# Patient Record
Sex: Female | Born: 1955 | Race: Black or African American | Hispanic: No | Marital: Married | Smoking: Former smoker
Health system: Southern US, Community
[De-identification: ages and names within clinical notes are randomized; demographics above are authoritative.]

## PROBLEM LIST (undated history)

## (undated) DIAGNOSIS — I10 Essential (primary) hypertension: Secondary | ICD-10-CM

## (undated) DIAGNOSIS — E119 Type 2 diabetes mellitus without complications: Secondary | ICD-10-CM

## (undated) HISTORY — PX: APPENDECTOMY: SHX54

---

## 2012-08-28 ENCOUNTER — Inpatient Hospital Stay (HOSPITAL_COMMUNITY)
Admission: EM | Admit: 2012-08-28 | Discharge: 2012-08-30 | DRG: 134 | Disposition: A | Discharge status: Home / Self Care | Payer: BC Managed Care – PPO | Attending: Internal Medicine | Admitting: Internal Medicine

## 2012-08-28 ENCOUNTER — Emergency Department (HOSPITAL_COMMUNITY): Payer: BC Managed Care – PPO

## 2012-08-28 ENCOUNTER — Encounter (HOSPITAL_COMMUNITY): Payer: Self-pay | Admitting: Emergency Medicine

## 2012-08-28 DIAGNOSIS — E119 Type 2 diabetes mellitus without complications: Secondary | ICD-10-CM

## 2012-08-28 DIAGNOSIS — Z8249 Family history of ischemic heart disease and other diseases of the circulatory system: Secondary | ICD-10-CM

## 2012-08-28 DIAGNOSIS — I248 Other forms of acute ischemic heart disease: Secondary | ICD-10-CM | POA: Diagnosis present

## 2012-08-28 DIAGNOSIS — I2489 Other forms of acute ischemic heart disease: Secondary | ICD-10-CM | POA: Diagnosis present

## 2012-08-28 DIAGNOSIS — Z91199 Patient's noncompliance with other medical treatment and regimen due to unspecified reason: Secondary | ICD-10-CM

## 2012-08-28 DIAGNOSIS — Z7982 Long term (current) use of aspirin: Secondary | ICD-10-CM

## 2012-08-28 DIAGNOSIS — I10 Essential (primary) hypertension: Principal | ICD-10-CM

## 2012-08-28 DIAGNOSIS — Z9119 Patient's noncompliance with other medical treatment and regimen: Secondary | ICD-10-CM

## 2012-08-28 DIAGNOSIS — R079 Chest pain, unspecified: Secondary | ICD-10-CM

## 2012-08-28 HISTORY — DX: Essential (primary) hypertension: I10

## 2012-08-28 HISTORY — DX: Type 2 diabetes mellitus without complications: E11.9

## 2012-08-28 LAB — CBC WITH DIFFERENTIAL/PLATELET
Basophils Relative: 0 % (ref 0–1)
Eosinophils Absolute: 0.3 10*3/uL (ref 0.0–0.7)
Eosinophils Relative: 4 % (ref 0–5)
Lymphs Abs: 2.4 10*3/uL (ref 0.7–4.0)
MCH: 29.8 pg (ref 26.0–34.0)
MCHC: 34.5 g/dL (ref 30.0–36.0)
MCV: 86.2 fL (ref 78.0–100.0)
Monocytes Relative: 6 % (ref 3–12)
Platelets: 249 10*3/uL (ref 150–400)
RBC: 4.5 MIL/uL (ref 3.87–5.11)

## 2012-08-28 LAB — PROTIME-INR
INR: 0.96 (ref 0.00–1.49)
Prothrombin Time: 12.6 seconds (ref 11.6–15.2)

## 2012-08-28 LAB — COMPREHENSIVE METABOLIC PANEL
Albumin: 3.8 g/dL (ref 3.5–5.2)
BUN: 19 mg/dL (ref 6–23)
Calcium: 9.9 mg/dL (ref 8.4–10.5)
GFR calc Af Amer: >90 mL/min (ref >90)
Glucose, Bld: 137 mg/dL — ABNORMAL HIGH (ref 70–99)
Sodium: 137 mEq/L (ref 135–145)
Total Protein: 7.3 g/dL (ref 6.0–8.3)

## 2012-08-28 LAB — RAPID URINE DRUG SCREEN, HOSP PERFORMED
Barbiturates: NOT DETECTED
Cocaine: NOT DETECTED
Tetrahydrocannabinol: POSITIVE — AB

## 2012-08-28 LAB — GLUCOSE, CAPILLARY: Glucose-Capillary: 110 mg/dL — ABNORMAL HIGH (ref 70–99)

## 2012-08-28 LAB — TROPONIN I: Troponin I: <0.3 ng/mL (ref <0.30)

## 2012-08-28 LAB — HEMOGLOBIN A1C
Hgb A1c MFr Bld: 6.4 % — ABNORMAL HIGH (ref <5.7)
Mean Plasma Glucose: 137 mg/dL — ABNORMAL HIGH (ref <117)

## 2012-08-28 LAB — CK TOTAL AND CKMB (NOT AT ARMC)
CK, MB: 4.3 ng/mL — ABNORMAL HIGH (ref 0.3–4.0)
Relative Index: 2.1 (ref 0.0–2.5)

## 2012-08-28 MED ORDER — LIVING WELL WITH DIABETES BOOK
Freq: Once | Status: AC
  Administered 2012-08-28: 18:00:00
  Filled 2012-08-28 (×2): qty 1

## 2012-08-28 MED ORDER — HYDRALAZINE HCL 20 MG/ML IJ SOLN
10.0000 mg | Freq: Four times a day (QID) | INTRAMUSCULAR | Status: DC | PRN
  Administered 2012-08-28 – 2012-08-29 (×2): 10 mg via INTRAVENOUS
  Filled 2012-08-28 (×2): qty 1

## 2012-08-28 MED ORDER — HYDRALAZINE HCL 20 MG/ML IJ SOLN: 5.0000 mg | Freq: Four times a day (QID) | INTRAMUSCULAR | Status: DC | PRN

## 2012-08-28 MED ORDER — SODIUM CHLORIDE 0.9 % IJ SOLN
3.0000 mL | Freq: Two times a day (BID) | INTRAMUSCULAR | Status: DC
  Administered 2012-08-28 – 2012-08-30 (×5): 3 mL via INTRAVENOUS

## 2012-08-28 MED ORDER — HEPARIN SODIUM (PORCINE) 5000 UNIT/ML IJ SOLN
5000.0000 [IU] | Freq: Three times a day (TID) | INTRAMUSCULAR | Status: DC
  Administered 2012-08-28 – 2012-08-29 (×2): 5000 [IU] via SUBCUTANEOUS
  Filled 2012-08-28 (×8): qty 1

## 2012-08-28 MED ORDER — ASPIRIN 81 MG PO CHEW: 324.0000 mg | CHEWABLE_TABLET | Freq: Once | ORAL | Status: DC

## 2012-08-28 MED ORDER — INSULIN ASPART 100 UNIT/ML ~~LOC~~ SOLN
0.0000 [IU] | Freq: Three times a day (TID) | SUBCUTANEOUS | Status: DC
  Administered 2012-08-29: 1 [IU] via SUBCUTANEOUS

## 2012-08-28 MED ORDER — MORPHINE SULFATE 2 MG/ML IJ SOLN: 2.0000 mg | INTRAMUSCULAR | Status: DC | PRN

## 2012-08-28 MED ORDER — ATORVASTATIN CALCIUM 80 MG PO TABS
80.0000 mg | ORAL_TABLET | Freq: Every day | ORAL | Status: DC
  Administered 2012-08-28: 80 mg via ORAL
  Filled 2012-08-28 (×3): qty 1

## 2012-08-28 MED ORDER — BLOOD PRESSURE CONTROL BOOK
Freq: Once | Status: AC
  Administered 2012-08-28: 18:00:00
  Filled 2012-08-28: qty 1

## 2012-08-28 MED ORDER — NITROGLYCERIN 0.4 MG SL SUBL: 0.4000 mg | SUBLINGUAL_TABLET | SUBLINGUAL | Status: DC | PRN

## 2012-08-28 MED ORDER — ASPIRIN EC 81 MG PO TBEC
81.0000 mg | DELAYED_RELEASE_TABLET | Freq: Every day | ORAL | Status: DC
  Administered 2012-08-29 – 2012-08-30 (×2): 81 mg via ORAL
  Filled 2012-08-28 (×2): qty 1

## 2012-08-28 MED ORDER — METOPROLOL SUCCINATE ER 50 MG PO TB24
50.0000 mg | ORAL_TABLET | Freq: Once | ORAL | Status: AC
  Administered 2012-08-28: 50 mg via ORAL
  Filled 2012-08-28: qty 1

## 2012-08-28 MED ORDER — EXERCISE FOR HEART AND HEALTH BOOK
Freq: Once | Status: AC
  Administered 2012-08-28: 18:00:00
  Filled 2012-08-28 (×2): qty 1

## 2012-08-28 MED ORDER — METOPROLOL SUCCINATE ER 25 MG PO TB24
25.0000 mg | ORAL_TABLET | Freq: Every day | ORAL | Status: DC
  Administered 2012-08-28: 25 mg via ORAL
  Filled 2012-08-28: qty 1

## 2012-08-28 MED ORDER — LABETALOL HCL 5 MG/ML IV SOLN
10.0000 mg | Freq: Once | INTRAVENOUS | Status: AC
  Administered 2012-08-28: 10 mg via INTRAVENOUS
  Filled 2012-08-28: qty 4

## 2012-08-28 MED ORDER — LISINOPRIL 10 MG PO TABS
10.0000 mg | ORAL_TABLET | Freq: Every day | ORAL | Status: DC
  Administered 2012-08-28: 10 mg via ORAL
  Filled 2012-08-28 (×2): qty 1

## 2012-08-28 MED ORDER — METOPROLOL SUCCINATE ER 100 MG PO TB24
100.0000 mg | ORAL_TABLET | Freq: Every day | ORAL | Status: DC
  Administered 2012-08-29 – 2012-08-30 (×2): 100 mg via ORAL
  Filled 2012-08-28 (×2): qty 1

## 2012-08-28 NOTE — Progress Notes (Signed)
Case manager met patient at bedside.Role of Case Manager explained.Epic showing patient without a PCP or health insurance.Patient reports she has [employer]/ [employer] but wants to obtain A new PCP. Patient [place] in [place].PCP- resources provided to patient from the blue cross website.Patient reports the PCP listing is  Near her home and she will follow up.Registartion  Contacted and they will add patients current PCP provider/ health Insurance to [facility].No further Case Manager needs identified at this time.

## 2012-08-28 NOTE — ED Notes (Signed)
First attempt made to call report to unit 3W without success.

## 2012-08-28 NOTE — ED Notes (Signed)
Report called to Marylu Lund, RN unit 3W.

## 2012-08-28 NOTE — ED Provider Notes (Signed)
CSN: [id number]     Arrival date & time 08/28/12  0918 History     First MD Initiated Contact with Patient 08/28/12 [phone]     Chief Complaint  Patient presents with  . Chest Pain  . Shoulder Pain   (Consider location/radiation/quality/duration/timing/severity/associated sxs/prior Treatment) HPI Comments: Patient presents with a three-day history of left sided chest heaviness that radiates to her left arm. She reports the pain comes and goes and lasts for several hours for minutes at a time. It is worse when she is lifting boxes at her job at [employer]. She denies any injury. She denies any shortness of breath, nausea, vomiting, diaphoresis or chills. She reports no cardiac history. She has a history of diabetes and hypertension. She had negative stress test more than 5 years ago. She currently does not have a doctor is not take any blood pressure medications. She denies any pain but endorses heaviness. She is able to ambulate without getting pain  and able to go upstairs without getting pain.  The history is provided by the patient.    Past Medical History  Diagnosis Date  . Hypertension   . Diabetes mellitus without complication    Past Surgical History  Procedure Laterality Date  . Cesarean section     History reviewed. No pertinent family history. History  Substance Use Topics  . Smoking status: Never Smoker   . Smokeless tobacco: Not on file  . Alcohol Use: No   OB History   Grav Para Term Preterm Abortions TAB SAB Ect Mult Living                 Review of Systems  Constitutional: Negative for fever, activity change and appetite change.  HENT: Negative for congestion and rhinorrhea.   Respiratory: Positive for chest tightness and shortness of breath. Negative for cough.   Cardiovascular: Positive for chest pain.  Gastrointestinal: Negative for nausea, vomiting and abdominal pain.  Genitourinary: Negative for dysuria and hematuria.  Skin: Negative for rash.  Neurological:  Negative for dizziness, weakness and headaches.  A complete 10 system review of systems was obtained and all systems are negative except as noted in the HPI and PMH.    Allergies  Codeine  Home Medications   Current Outpatient Rx  Name  Route  Sig  Dispense  Refill  . aspirin 325 MG EC tablet   Oral   Take 325 mg by mouth daily.         . metFORMIN (GLUCOPHAGE) 850 MG tablet   Oral   Take 850 mg by mouth 2 (two) times daily with a meal.          BP 214/101  Pulse 78  Temp(Src) 98.1 F (36.7 C) (Oral)  Resp 15  Ht 5\' 1"  (1.549 m)  Wt 185 lb (83.915 kg)  BMI 34.97 kg/m2  SpO2 100% Physical Exam  Constitutional: She is oriented to person, place, and time. She appears well-developed and well-nourished. No distress.  HENT:  Head: Normocephalic and atraumatic.  Mouth/Throat: Oropharynx is clear and moist. No oropharyngeal exudate.  Eyes: Conjunctivae and EOM are normal. Pupils are equal, round, and reactive to light.  Neck: Normal range of motion. Neck supple.  Cardiovascular: Normal rate, regular rhythm, normal heart sounds and intact distal pulses.   No murmur heard. Pulmonary/Chest: Effort normal and breath sounds normal. No respiratory distress. She exhibits no tenderness.  Abdominal: Soft. There is no tenderness. There is no rebound and no guarding.  obese  Musculoskeletal: Normal range of motion. She exhibits no edema and no tenderness.  Intact DP, PT, radial pulses  Neurological: She is alert and oriented to person, place, and time. No cranial nerve deficit. She exhibits normal muscle tone. Coordination normal.  Skin: Skin is warm. She is not diaphoretic.    ED Course   Procedures (including critical care time)  Labs Reviewed  COMPREHENSIVE METABOLIC PANEL - Abnormal; Notable for the following:    Glucose, Bld 137 (*)    All other components within normal limits  CBC WITH DIFFERENTIAL  TROPONIN I   Dg Chest 2 View  08/28/2012   *RADIOLOGY REPORT*   Clinical Data: Left-sided chest pain  CHEST - 2 VIEW  Comparison: None.  Findings: Lungs are clear. No pleural effusion or pneumothorax.  The heart is top normal in size.  Degenerative changes of the visualized thoracolumbar spine.  IMPRESSION: No evidence of acute cardiopulmonary disease.   Original Report Authenticated By: Charline Bills, M.D.   1. Chest pain   2. Hypertension     MDM  Intermittent chest heaviness that radiates to her left arm it is worse with exertion. Hypertensive and noncompliant with medications.  EKG shows nonspecific ST changes. Troponin is negative. Blood pressure has spontaneously improved to 190 systolic. Heart score 4. Patient's chest pain has both typical and atypical features. Given her history of obesity, diabetes, hypertension, will admit for observation and cardiac rule out.    Date: 08/28/2012  Rate: 71  Rhythm: normal sinus rhythm  QRS Axis: normal  Intervals: normal  ST/T Wave abnormalities: nonspecific ST/T changes  Conduction Disutrbances:none  Narrative Interpretation:   Old EKG Reviewed: none available    Glynn Octave, MD 08/28/12 1210

## 2012-08-28 NOTE — H&P (Signed)
Date: 08/28/2012               Patient Name:  Leslie White MRN: [id number]  DOB: 1955/07/27 Age / Sex: 57 y.o., female   PCP: Pcp Not In System         Medical Service: Internal Medicine Teaching Service         Attending Physician: Dr. Judyann Munson, MD    First Contact: Dr. Yetta Barre Pager: [phone]  Second Contact: Dr. Verdie Mosher Pager: [phone]       After Hours (After 5p/  First Contact Pager: [phone]  weekends / holidays): Second Contact Pager: [phone]   Chief Complaint: Chest pain  History of Present Illness:   Ms. Ruqayyah Lute is a 57 y.o. y/o female w/ PMHx of HTN and DM presents to the ED w/ complaints of chest pain. She says she has had chest pain for the last few days on and off and admits that it is mostly on exertion. She works at [employer] and says she lifts boxes and is always on her feet and feels pain at her chest during these times. This morning, the pain woke her from her sleep and she says it was about a 5/10 in severity and is a dull pain/pressure located in her upper left chest that radiates into her jaw, shoulder and left arm. She says she took an ASA 325 mg at home and it did not provide relief. She says the pain is not worsened by movement or deep inspiration. She denies any associated symptoms such as SOB, DOE, lightheadedness, dizziness, diaphoresis, nausea, or vomiting. The patient dies state that her mother had heart problems and says she had her first heart attack in her 35's.  While in the ED, the patient was shown to have severely elevated blood pressure with a systolic in the 220's. She has not been compliant with her blood pressure medications recently because she is in between PCP's and did not get any refills. She usually takes metoprolol 100 mg bid and prinzide 20-12.5 qd. The patient denies any recent extended travel, h/o DVT/PE, clotting disorders, or h/o smoking. She also denies any recent fever, chills, cough, or URI symptoms. CXR shows  no evidence of cardiopulmonary disease.  Meds: Current Facility-Administered Medications  Medication Dose Route Frequency Provider Last Rate Last Dose  . [START ON 08/29/2012] aspirin EC tablet 81 mg  81 mg Oral Daily Lorretta Harp, MD      . atorvastatin (LIPITOR) tablet 80 mg  80 mg Oral q1800 Lorretta Harp, MD      . heparin injection 5,000 Units  5,000 Units Subcutaneous Q8H Lorretta Harp, MD      . insulin aspart (novoLOG) injection 0-9 Units  0-9 Units Subcutaneous TID WC Lorretta Harp, MD      . lisinopril (PRINIVIL,ZESTRIL) tablet 10 mg  10 mg Oral Daily Lorretta Harp, MD      . metoprolol succinate (TOPROL-XL) 24 hr tablet 25 mg  25 mg Oral Daily Lorretta Harp, MD   25 mg at 08/28/12 1357  . morphine 2 MG/ML injection 2 mg  2 mg Intravenous Q4H PRN Lorretta Harp, MD      . nitroGLYCERIN (NITROSTAT) SL tablet 0.4 mg  0.4 mg Sublingual Q5 min PRN Glynn Octave, MD      . sodium chloride 0.9 % injection 3 mL  3 mL Intravenous Q12H Lorretta Harp, MD        Allergies: Allergies as of 08/28/2012 - Review Complete 08/28/2012  Allergen Reaction  Noted  . Codeine Nausea And Vomiting 08/28/2012   Past Medical History  Diagnosis Date  . Hypertension   . Diabetes mellitus without complication    Past Surgical History  Procedure Laterality Date  . Cesarean section     History reviewed. No pertinent family history. History   Social History  . Marital Status: Married    Spouse Name: N/A    Number of Children: N/A  . Years of Education: N/A   Occupational History  . Not on file.   Social History Main Topics  . Smoking status: Never Smoker   . Smokeless tobacco: Not on file  . Alcohol Use: No  . Drug Use: No  . Sexually Active: Not on file   Other Topics Concern  . Not on file   Social History Narrative  . No narrative on file    Review of Systems: General: Denies fever, chills, diaphoresis, appetite change and fatigue.  HEENT: Denies change in vision, eye pain, redness, hearing loss, congestion,  sore throat, rhinorrhea, sneezing, mouth sores, trouble swallowing, neck pain, neck stiffness and tinnitus.   Respiratory: Denies SOB, DOE, cough, chest tightness, and wheezing.   Cardiovascular: Positive for chest pain. Denies palpitations and leg swelling.  Gastrointestinal: Denies nausea, vomiting, abdominal pain, diarrhea, constipation, blood in stool and abdominal distention.  Genitourinary: Denies dysuria, urgency, frequency, hematuria, flank pain and difficulty urinating.  Endocrine: Denies hot or cold intolerance, sweats, polyuria, polydipsia. Musculoskeletal: Denies myalgias, back pain, joint swelling, arthralgias and gait problem.  Skin: Denies pallor, rash and wounds.  Neurological: Denies dizziness, seizures, syncope, weakness, lightheadedness, numbness and headaches.  Hematological: Denies adenopathy,easy bruising, personal or family bleeding history.  Psychiatric/Behavioral: Denies mood changes, confusion, nervousness, sleep disturbance and agitation.  Physical Exam: Filed Vitals:   08/28/12 1330 08/28/12 1345 08/28/12 1350 08/28/12 1450  BP: 177/84 170/75 170/75 207/80  Pulse: 67 64 68 64  Temp:    98.3 F (36.8 C)  TempSrc:    Oral  Resp: 16 12 18 16   Height:    5\' 1"  (1.549 m)  Weight:    182 lb 5.1 oz (82.7 kg)  SpO2: 100% 100% 99% 100%   General: Vital signs reviewed.  Patient is a well-developed and well-nourished, in no acute distress and cooperative with exam. Alert and oriented x3.  Head: Normocephalic and atraumatic. Nose: No erythema or drainage noted.  Turbinates normal. Mouth: No erythema, exudates, sores, or ulcerations. Moist mucus membranes. Eyes: PERRL, EOMI, conjunctivae normal, No scleral icterus.  Neck: Supple, trachea midline, normal ROM, No JVD, masses, thyromegaly, or carotid bruit present.  Cardiovascular: RRR, S1 normal, S2 normal, no murmurs, gallops, or rubs. Pulmonary/Chest: Normal respiratory effort, CTAB, no wheezes, rales, or  rhonchi. Abdominal: Soft. Non-tender, non-distended, bowel sounds are normal, no masses, organomegaly, or guarding present.  Musculoskeletal: No joint deformities, erythema, or stiffness, ROM full and no nontender. Extremities: No swelling or edema,  pulses symmetric and intact bilaterally. No cyanosis or clubbing. Hematology: no cervical, inginal, or axillary adenopathy.  Neurological: A&O x3, Strength is normal and symmetric bilaterally, cranial nerve II-XII are grossly intact, no focal motor deficit, sensory intact to light touch bilaterally.  Skin: Warm, dry and intact. No rashes or erythema. Psychiatric: Normal mood and affect. speech and behavior is normal. Cognition and memory are normal.   Lab results: Basic Metabolic Panel:  Recent Labs  45/40/98 0950  NA 137  K 4.0  CL 100  CO2 25  GLUCOSE 137*  BUN 19  CREATININE  0.76  CALCIUM 9.9   Liver Function Tests:  Recent Labs  08/28/12 0950  AST 19  ALT 20  ALKPHOS 70  BILITOT 0.4  PROT 7.3  ALBUMIN 3.8   CBC:  Recent Labs  08/28/12 0950  WBC 5.8  NEUTROABS 2.8  HGB 13.4  HCT 38.8  MCV 86.2  PLT 249   Cardiac Enzymes:  Recent Labs  08/28/12 0950 08/28/12 1230  CKTOTAL  --  209*  CKMB  --  4.3*  TROPONINI <0.30 <0.30   BNP: No results found for this basename: PROBNP,  in the last 72 hours D-Dimer: No results found for this basename: DDIMER,  in the last 72 hours CBG: No results found for this basename: GLUCAP,  in the last 72 hours Hemoglobin A1C: No results found for this basename: HGBA1C,  in the last 72 hours Fasting Lipid Panel: No results found for this basename: CHOL, HDL, LDLCALC, TRIG, CHOLHDL, LDLDIRECT,  in the last 72 hours Thyroid Function Tests: No results found for this basename: TSH, T4TOTAL, FREET4, T3FREE, THYROIDAB,  in the last 72 hours Anemia Panel: No results found for this basename: VITAMINB12, FOLATE, FERRITIN, TIBC, IRON, RETICCTPCT,  in the last 72  hours Coagulation:  Recent Labs  08/28/12 1227  LABPROT 12.6  INR 0.96   Imaging results:  Dg Chest 2 View  08/28/2012   *RADIOLOGY REPORT*  Clinical Data: Left-sided chest pain  CHEST - 2 VIEW  Comparison: None.  Findings: Lungs are clear. No pleural effusion or pneumothorax.  The heart is top normal in size.  Degenerative changes of the visualized thoracolumbar spine.  IMPRESSION: No evidence of acute cardiopulmonary disease.   Original Report Authenticated By: Charline Bills, M.D.   Other results: EKG: NSR @ 71 bpm. RAE. Twi's in inferior and lateral leads. No previous ECG.  Assessment & Plan by Problem:  #Possible NSTEMI vs non-cardiac related chest pain- Ms. Nakiea Metzner is a 57 y.o. y/o female w/ PMHx of HTN and DM presents to the ED w/ complaints of chest pain for the past 3 days. On arrival to the ED, the patient's blood pressure was very high w/ systolic in the 220's. The patient admits to recent non-compliance to HTN medications because she is in between PCP's and did not have any refills. He chest pain is most likely related to a demand ischemia 2/2 hypertensive emergency.  -Troponins are negative x2. Continue cycling. -ECG shows twi's in inferior and lateral leads, but no previous ECG for comparison. Obtain another ECG to assess for dynamic changes after blood pressure has been controlled. -CK MB and CK total are elevated at 4.3 and 209, respectively.  -Continue ASA 81 mg  #Hypetensive Urgency vs. Emergency- Patient presented to the ED w/ systolic BP in the 220's and has been non-compliant w/ her BP meds at home.  -Given labetalol 10 mg in the ED.  -Started on home meds; giving metoprolol 100 mg qd + lisinopril 10 mg qd -Give Hydralazine 5 mg prn for SBP >190  #DM -On sensitive insulin SS coverage. -Metformin held.  DVT PPx: Heparin  Dispo: Disposition is deferred at this time, awaiting improvement of current medical problems. Anticipated discharge in  approximately 1-2 day(s).   The patient does not have a current PCP (Pcp Not In System) and does not need an [facility] hospital follow-up appointment after discharge.  The patient does not have transportation limitations that hinder transportation to clinic appointments.  Signed: Lars Masson, MD 08/28/2012, 3:21 PM

## 2012-08-28 NOTE — Progress Notes (Signed)
Pt cont to be HTN even after receiving lisinopril and metoprolol 186/83 MD notified.

## 2012-08-28 NOTE — ED Notes (Signed)
Second attempt to call report to unit 3W without success.

## 2012-08-28 NOTE — ED Notes (Signed)
Pt reports left sided CP that radiates to left arm x 2 days. Pt has not taken her prescribed BP meds for about 3 months. Pt does not have a PMD at this time.

## 2012-08-29 ENCOUNTER — Other Ambulatory Visit: Payer: Self-pay

## 2012-08-29 LAB — GLUCOSE, CAPILLARY
Glucose-Capillary: 129 mg/dL — ABNORMAL HIGH (ref 70–99)
Glucose-Capillary: 103 mg/dL — ABNORMAL HIGH (ref 70–99)
Glucose-Capillary: 119 mg/dL — ABNORMAL HIGH (ref 70–99)

## 2012-08-29 LAB — LIPID PANEL
Cholesterol: 180 mg/dL (ref 0–200)
Total CHOL/HDL Ratio: 3.3 RATIO
Triglycerides: 80 mg/dL (ref <150)

## 2012-08-29 LAB — BASIC METABOLIC PANEL
CO2: 29 mEq/L (ref 19–32)
Chloride: 102 mEq/L (ref 96–112)
Creatinine, Ser: 0.79 mg/dL (ref 0.50–1.10)
GFR calc Af Amer: >90 mL/min (ref >90)
Sodium: 139 mEq/L (ref 135–145)

## 2012-08-29 MED ORDER — LISINOPRIL 20 MG PO TABS
20.0000 mg | ORAL_TABLET | Freq: Every day | ORAL | Status: DC
  Administered 2012-08-29 – 2012-08-30 (×2): 20 mg via ORAL
  Filled 2012-08-29 (×2): qty 1

## 2012-08-29 MED ORDER — ATORVASTATIN CALCIUM 80 MG PO TABS: 40.0000 mg | ORAL_TABLET | Freq: Every day | ORAL | Status: DC

## 2012-08-29 MED ORDER — NITROGLYCERIN 0.4 MG SL SUBL: 0.4000 mg | SUBLINGUAL_TABLET | SUBLINGUAL | Status: DC | PRN

## 2012-08-29 MED ORDER — LISINOPRIL 20 MG PO TABS: 20.0000 mg | ORAL_TABLET | Freq: Every day | ORAL | Status: DC

## 2012-08-29 MED ORDER — HYDROCHLOROTHIAZIDE 25 MG PO TABS: 25.0000 mg | ORAL_TABLET | Freq: Every day | ORAL | Status: DC

## 2012-08-29 MED ORDER — HYDROCHLOROTHIAZIDE 25 MG PO TABS
25.0000 mg | ORAL_TABLET | Freq: Every day | ORAL | Status: DC
  Administered 2012-08-29 – 2012-08-30 (×2): 25 mg via ORAL
  Filled 2012-08-29 (×2): qty 1

## 2012-08-29 NOTE — H&P (Signed)
Date: 08/29/2012  Patient name: Leslie White  Medical record number: [id number]  Date of birth: 1956-01-10   I have seen and evaluated Leslie White and discussed their care with the Residency Team.  Leslie White is a pleasant 57yo F with known history of HTN, ran out of medications x 1 month and now presents with chest pressure x 3-5 days with pain radiating down arms. On admit found to have hypertensive urgency, and evaluated/admitted to rule out acute coronary syndrome, and management of hypertension. She feel much improved since being admitted although still has some residual chest pressure, 2/10 within first 24hrs of admit  Physical Exam: Blood pressure 194/72, pulse 66, temperature 98.3 F (36.8 C), temperature source Oral, resp. rate 20, height 5\' 1"  (1.549 m), weight 180 lb 11.2 oz (81.965 kg), SpO2 99.00%. Physical Exam  Constitutional:  oriented to person, place, and time. appears well-developed and well-nourished. No distress.  HENT:  Mouth/Throat: Oropharynx is clear and moist. No oropharyngeal exudate.  Cardiovascular: Normal rate, regular rhythm and normal heart sounds. Exam reveals no gallop and no friction rub.  No murmur heard.  Pulmonary/Chest: Effort normal and breath sounds normal. No respiratory distress.has no wheezes.  Abdominal: Soft. Bowel sounds are normal. exhibits no distension. There is no tenderness.  Lymphadenopathy:  no cervical adenopathy.  Neurological:  alert and oriented to person, place, and time.  Skin: Skin is warm and dry. No rash noted. No erythema.  Psychiatric: a normal mood and affect.  behavior is normal.    Lab results: Results for orders placed during the hospital encounter of 08/28/12 (from the past 24 hour(s))  GLUCOSE, CAPILLARY     Status: Abnormal   Collection Time    08/28/12  4:53 PM      Result Value Range   Glucose-Capillary 110 (*) 70 - 99 mg/dL   Comment 1 Notify RN    TROPONIN I     Status: None     Collection Time    08/28/12  7:03 PM      Result Value Range   Troponin I <0.30  <0.30 ng/mL  TROPONIN I     Status: None   Collection Time    08/29/12 12:10 AM      Result Value Range   Troponin I <0.30  <0.30 ng/mL  BASIC METABOLIC PANEL     Status: Abnormal   Collection Time    08/29/12  7:35 AM      Result Value Range   Sodium 139  135 - 145 mEq/L   Potassium 3.9  3.5 - 5.1 mEq/L   Chloride 102  96 - 112 mEq/L   CO2 29  19 - 32 mEq/L   Glucose, Bld 122 (*) 70 - 99 mg/dL   BUN 17  6 - 23 mg/dL   Creatinine, Ser 9.56  0.50 - 1.10 mg/dL   Calcium 9.8  8.4 - 21.3 mg/dL   GFR calc non Af Amer >90  >90 mL/min   GFR calc Af Amer >90  >90 mL/min  GLUCOSE, CAPILLARY     Status: Abnormal   Collection Time    08/29/12  7:44 AM      Result Value Range   Glucose-Capillary 129 (*) 70 - 99 mg/dL  GLUCOSE, CAPILLARY     Status: Abnormal   Collection Time    08/29/12 12:07 PM      Result Value Range   Glucose-Capillary 103 (*) 70 - 99 mg/dL  LIPID PANEL  Status: Abnormal   Collection Time    08/29/12  2:43 PM      Result Value Range   Cholesterol 180  0 - 200 mg/dL   Triglycerides 80  <161 mg/dL   HDL 54  >09 mg/dL   Total CHOL/HDL Ratio 3.3     VLDL 16  0 - 40 mg/dL   LDL Cholesterol 604 (*) 0 - 99 mg/dL    Imaging results:  Dg Chest 2 View  08/28/2012   *RADIOLOGY REPORT*  Clinical Data: Left-sided chest pain  CHEST - 2 VIEW  Comparison: None.  Findings: Lungs are clear. No pleural effusion or pneumothorax.  The heart is top normal in size.  Degenerative changes of the visualized thoracolumbar spine.  IMPRESSION: No evidence of acute cardiopulmonary disease.   Original Report Authenticated By: Charline Bills, M.D.    Assessment and Plan: I have seen and evaluated the patient as outlined above. I agree with the formulated Assessment and Plan as detailed in the residents' admission note, with the following changes:   Agree with Dr. Yetta Barre' assessment and plan for  hypertension management and ruling out of acute coronary syndrome  Judyann Munson, MD 8/4/20143:47 PM

## 2012-08-29 NOTE — Discharge Summary (Signed)
Name: Leslie White MRN: [id number] DOB: [birth year] 57 y.o. PCP: Pcp Not In System  Date of Admission: 08/28/2012  9:19 AM Date of Discharge: 08/30/2012 Attending Physician: No att. providers found  Discharge Diagnosis: 1. Hypertensive Urgency   Discharge Medications:   Medication List    STOP taking these medications       lisinopril-hydrochlorothiazide 20-12.5 MG per tablet  Commonly known as:  PRINZIDE,ZESTORETIC      TAKE these medications       aspirin 325 MG EC tablet  Take 325 mg by mouth daily.     atorvastatin 80 MG tablet  Commonly known as:  LIPITOR  Take 0.5 tablets (40 mg total) by mouth daily at 6 PM.     hydrochlorothiazide 25 MG tablet  Commonly known as:  HYDRODIURIL  Take 1 tablet (25 mg total) by mouth daily.     lisinopril 20 MG tablet  Commonly known as:  PRINIVIL,ZESTRIL  Take 1 tablet (20 mg total) by mouth daily.     metFORMIN 850 MG tablet  Commonly known as:  GLUCOPHAGE  Take 850 mg by mouth 2 (two) times daily with a meal.     metoprolol 100 MG tablet  Commonly known as:  LOPRESSOR  Take 100 mg by mouth 2 (two) times daily.     nitroGLYCERIN 0.4 MG SL tablet  Commonly known as:  NITROSTAT  Place 1 tablet (0.4 mg total) under the tongue every 5 (five) minutes as needed for chest pain.     VITAMIN D2 PO  Take 1 capsule by mouth See admin instructions. 1.25mg . Take one capsule by mouth every two weeks. No specific day she takes it on.        Disposition and follow-up:   [name] was discharged from [facility] in Good condition.  At the hospital follow up visit please address:  1.  Her current BP medication regimen and optimizing her therapy. Consider increasing Lisinopril dose or adding Norvasc.  2.  Labs / imaging needed at time of follow-up: none  3.  Pending labs/ test needing follow-up: none  Follow-up Appointments:     Follow-up Information   Follow up with Rocco Serene, MD On 09/06/2012. (3:30 PM)    Contact information:   [address] [place] [facility] [place] [address] [phone]       Discharge Instructions: Discharge Orders   Future Appointments Provider Department Dept Phone   09/07/2012 10:30 AM Rocco Serene, MD [facility] [phone]   Future Orders Complete By Expires     Call MD for:  difficulty breathing, headache or visual disturbances  As directed     Call MD for:  severe uncontrolled pain  As directed     Diet - low sodium heart healthy  As directed     Increase activity slowly  As directed        Consultations:  none  Procedures Performed:  Dg Chest 2 View  08/28/2012   *RADIOLOGY REPORT*  Clinical Data: Left-sided chest pain  CHEST - 2 VIEW  Comparison: None.  Findings: Lungs are clear. No pleural effusion or pneumothorax.  The heart is top normal in size.  Degenerative changes of the visualized thoracolumbar spine.  IMPRESSION: No evidence of acute cardiopulmonary disease.   Original Report Authenticated By: Charline Bills, M.D.    Admission HPI:  [name] is a 57 y.o. y/o female w/ PMHx of HTN and DM presents  to the ED w/ complaints of chest pain. She says she has had chest pain for the last few days on and off and admits that it is mostly on exertion. She works at [employer] and says she lifts boxes and is always on her feet and feels pain at her chest during these times. This morning, the pain woke her from her sleep and she says it was about a 5/10 in severity and is a dull pain/pressure located in her upper left chest that radiates into her jaw, shoulder and left arm. She says she took an ASA 325 mg at home and it did not provide relief. She says the pain is not worsened by movement or deep inspiration. She denies any associated symptoms such as SOB, DOE, lightheadedness, dizziness, diaphoresis, nausea, or vomiting. The patient did state that her  mother had heart problems and says she had her first heart attack in her 48's.  While in the ED, the patient was shown to have severely elevated blood pressure with a systolic in the 220's. She has not been compliant with her blood pressure medications recently because she is in between PCP's and did not get any refills. She usually takes metoprolol 100 mg bid and prinzide 20-12.5 qd.  The patient denies any recent extended travel, h/o DVT/PE, clotting disorders, or h/o smoking. She also denies any recent fever, chills, cough, or URI symptoms. CXR shows no evidence of cardiopulmonary disease.  Hospital Course by problem list: Principal Problem:   Chest pain Active Problems:   Hypertension   Diabetes mellitus without complication   1. Chest pain 2/2 demand ischemia vs non-cardiac related chest pain The patient reported ongoing chest pain mostly with exertion at work, but woke her up from sleep the night prior to her admission. On arrival to the ED, the patient had an SBP in the 220's. She reportedly had been non-compliant with her BP meds for 1 month 2/2 being in between PCP's. She described a pain present in her left chest that radiated into her jaw and shoulder. Her troponins were cycled and ECG performed to r/o ACS. Troponins were -ve x3 but ECG showed twi's in inferior and lateral leads, w/ no prior ECG for comparison. CK MB was also sent, and came back at 4.3, slightly above the ULN. She was only given ASA 81 in the ED as her pain was only 4/10 in severity. No nitroglycerin was given. The next day, a repeat ECG was performed and the twi's in inferior leads had corrected. Over the next day, her chest pain became significantly less and became very localized to the left lower chest and was a 2/10 in severity . Her chest pain had completely subsided prior to her discharge on 08/30/12. The patient also reported a family history of CAD. Her mother had her first MI in her 68's. A lipid panel was performed  and showed an LDL of 110. The patient was treated with Lipitor 80 mg in the hospital and discharged on 40 mg qhs.   2. Hypertensive Urgency Patient had SBP in the 220's. She was given Labetalol 10 mg in the ED w/ very little improvement in blood pressure. She was started on Metoprolol 100 mg qd and Lisinopril 10 mg qd, as this was close to her home regimen. Her SBP still remained in the 180's to 200's. She was given Hydralazine 5 mg prn for SBP over 190. No symptoms except for some mild localized left sided chest pain were present at this  time. She denied any change in vision, dizziness, headache, numbness, tingling, palpitations, or SOB. On her next day, her BP was still significantly elevated, so we increased her lisinopril to 20 mg and added HCTZ 25 mg, controlling her BP into the 150's-160's. The patient reports this as her baseline.   3. DM The patient did not have any diabetic issues during this hospitalization. Her blood sugars were well controlled on sensitive ISS. She usually takes Metformin 850 mg bid at home.  Discharge Vitals:   BP 166/82  Pulse 68  Temp(Src) 98.2 F (36.8 C) (Oral)  Resp 20  Ht 5\' 1"  (1.549 m)  Wt 178 lb 3.2 oz (80.831 kg)  BMI 33.69 kg/m2  SpO2 100%  Discharge Labs:  Results for orders placed during the hospital encounter of 08/28/12 (from the past 24 hour(s))  GLUCOSE, CAPILLARY     Status: Abnormal   Collection Time    08/29/12  9:02 PM      Result Value Range   Glucose-Capillary 119 (*) 70 - 99 mg/dL  GLUCOSE, CAPILLARY     Status: Abnormal   Collection Time    08/30/12  7:59 AM      Result Value Range   Glucose-Capillary 114 (*) 70 - 99 mg/dL  GLUCOSE, CAPILLARY     Status: Abnormal   Collection Time    08/30/12 11:18 AM      Result Value Range   Glucose-Capillary 115 (*) 70 - 99 mg/dL    Signed: Lars Masson, MD 08/30/2012, 5:15 PM   Time Spent on Discharge: 35 minutes Services Ordered on Discharge: none Equipment Ordered on Discharge:  none

## 2012-08-29 NOTE — Progress Notes (Signed)
Utilization review completed. Aydden Cumpian, RN, BSN. 

## 2012-08-29 NOTE — Progress Notes (Signed)
Subjective: Leslie White is a 57 y.o. y/o female w/ PMHx of HTN and DM presents to the ED w/ complaints of chest pain, most likely secondary to demand ischemia d/t hypertensive urgency. While in the ED, systolic BP was in the 220's. Troponins -ve x3, ECG shows twi's in inferior and lateral leads. This AM, inferior twi's have corrected.  Patient seen at bedside this AM. She says she feels much better, specifying her chest pain as localized to the left lower chest and 1-2/10 in severity. She denies any other symptoms such as SOB, diaphoresis, nausea, vomiting, jaw or shoulder pain.  BP still high today w/ systolic in the 150's to 180's. Otherwise, vitals are normal.  Objective: Vital signs in last 24 hours: Filed Vitals:   08/29/12 0034 08/29/12 0518 08/29/12 0912 08/29/12 0946  BP: 157/50 162/73 197/81 189/81  Pulse: 66 68 75 66  Temp: 98.5 F (36.9 C) 98.1 F (36.7 C) 98.3 F (36.8 C)   TempSrc: Oral Oral Oral   Resp: 18 18 20    Height:      Weight:  180 lb 11.2 oz (81.965 kg)    SpO2: 99% 100% 99%    Weight change:   Intake/Output Summary (Last 24 hours) at 08/29/12 1418 Last data filed at 08/28/12 1800  Gross per 24 hour  Intake    363 ml  Output      0 ml  Net    363 ml   Physical Exam: General: Alert, cooperative, and in no apparent distress HEENT: Vision grossly intact, oropharynx clear and non-erythematous  Neck: Full range of motion without pain, supple, no lymphadenopathy or carotid bruits Lungs: Clear to ascultation bilaterally, normal work of respiration, no wheezes, rales, ronchi Heart: Regular rate and rhythm, no murmurs, gallops, or rubs Abdomen: Soft, non-tender, non-distended, normal bowel sounds Extremities: No cyanosis, clubbing, or edema Neurologic: Alert & oriented X3, cranial nerves II-XII intact, strength grossly intact, sensation intact to light touch  Lab Results: Basic Metabolic Panel:  Recent Labs Lab 08/28/12 0950  08/29/12 0735  NA 137 139  K 4.0 3.9  CL 100 102  CO2 25 29  GLUCOSE 137* 122*  BUN 19 17  CREATININE 0.76 0.79  CALCIUM 9.9 9.8   Liver Function Tests:  Recent Labs Lab 08/28/12 0950  AST 19  ALT 20  ALKPHOS 70  BILITOT 0.4  PROT 7.3  ALBUMIN 3.8   CBC:  Recent Labs Lab 08/28/12 0950  WBC 5.8  NEUTROABS 2.8  HGB 13.4  HCT 38.8  MCV 86.2  PLT 249   Cardiac Enzymes:  Recent Labs Lab 08/28/12 0950 08/28/12 1230 08/28/12 1903 08/29/12 0010  CKTOTAL  --  209*  --   --   CKMB  --  4.3*  --   --   TROPONINI <0.30 <0.30 <0.30 <0.30   BNP: No results found for this basename: PROBNP,  in the last 168 hours D-Dimer: No results found for this basename: DDIMER,  in the last 168 hours CBG:  Recent Labs Lab 08/28/12 1653 08/29/12 0744 08/29/12 1207  GLUCAP 110* 129* 103*   Hemoglobin A1C:  Recent Labs Lab 08/28/12 1227  HGBA1C 6.4*   Fasting Lipid Panel: No results found for this basename: CHOL, HDL, LDLCALC, TRIG, CHOLHDL, LDLDIRECT,  in the last 168 hours  Coagulation:  Recent Labs Lab 08/28/12 1227  LABPROT 12.6  INR 0.96   Urine Drug Screen: Drugs of Abuse     Component Value Date/Time  LABOPIA NONE DETECTED 08/28/2012 1445   COCAINSCRNUR NONE DETECTED 08/28/2012 1445   LABBENZ NONE DETECTED 08/28/2012 1445   AMPHETMU NONE DETECTED 08/28/2012 1445   THCU POSITIVE* 08/28/2012 1445   LABBARB NONE DETECTED 08/28/2012 1445   Studies/Results: Dg Chest 2 View  08/28/2012   *RADIOLOGY REPORT*  Clinical Data: Left-sided chest pain  CHEST - 2 VIEW  Comparison: None.  Findings: Lungs are clear. No pleural effusion or pneumothorax.  The heart is top normal in size.  Degenerative changes of the visualized thoracolumbar spine.  IMPRESSION: No evidence of acute cardiopulmonary disease.   Original Report Authenticated By: Charline Bills, M.D.   Medications: I have reviewed the patient's current medications. Scheduled Meds: . aspirin EC  81 mg Oral Daily  .  atorvastatin  80 mg Oral q1800  . heparin  5,000 Units Subcutaneous Q8H  . hydrochlorothiazide  25 mg Oral Daily  . insulin aspart  0-9 Units Subcutaneous TID WC  . lisinopril  20 mg Oral Daily  . metoprolol succinate  100 mg Oral Daily  . sodium chloride  3 mL Intravenous Q12H   Continuous Infusions:  PRN Meds:.hydrALAZINE, morphine injection, nitroGLYCERIN  Assessment/Plan:  #Demand ischemia 2/2 hypertensive episode vs non-cardiac related chest pain- [name] is a 57 y/o female w/ PMHx of HTN and DM presents to the ED w/ complaints of chest pain for the past 3 days. On arrival to the ED, the patient's blood pressure was very high w/ systolic in the 220's. The patient admits to recent non-compliance to HTN medications because she is in between PCP's and did not have any refills. Usually takes metoprolol 100 mg bid and prinzide 20-12.5 qd, but has not taken BP meds for 1 month. Chest pain most likely d/t demand ischemia b/c of hypertensive episode. -Troponins are negative x3. CK MB and CK total are elevated at 4.3 and 209, respectively.  -ECG shows twi's in inferior and lateral leads yesterday, but inferior inversion have corrected today. -Started on Metoprolol 100 mg qd, Lisinopril 20 mg, and HCTZ 25 mg. -Continue ASA 81 mg  -Continue Lipitor 80 mg. Lipid panel pending.  #Hypetensive Urgency vs. Emergency- Patient presented to the ED w/ systolic BP in the 220's and has been non-compliant w/ her BP meds at home.  -Started on Metoprolol 100 mg qd, Lisinopril 20 mg, and HCTZ 25 mg. -Give Hydralazine 5 mg prn for SBP >190   #DM  -On sensitive insulin SS coverage.  -Metformin held.   #DVT PPx: Heparin  Dispo: Disposition is deferred at this time, awaiting improvement of current medical problems.  Anticipated discharge in approximately 1-2 day(s).   The patient does not have a current PCP (Pcp Not In System) and does need an [facility] hospital follow-up appointment after  discharge.  The patient does not have transportation limitations that hinder transportation to clinic appointments.  .Services Needed at time of discharge: Y = Yes, Blank = No PT:   OT:   RN:   Equipment:   Other:     LOS: 1 day   Leslie Masson, MD 08/29/2012, 2:18 PM

## 2012-08-30 LAB — GLUCOSE, CAPILLARY: Glucose-Capillary: 115 mg/dL — ABNORMAL HIGH (ref 70–99)

## 2012-08-30 NOTE — Progress Notes (Signed)
Patient discharged home with husband. Discharge instructions reviewed with patient.

## 2012-08-30 NOTE — Progress Notes (Signed)
Subjective: [name] is a 57 y.o. y/o female w/ PMHx of HTN and DM presents to the ED w/ complaints of chest pain, most likely secondary to demand ischemia d/t hypertensive urgency. While in the ED, systolic BP was in the 220's. Troponins -ve x3, ECG shows twi's in inferior and lateral leads. This AM, inferior twi's have corrected. Patient seen at bedside this AM. She has no complaints today. Slept very well. Denies any chest pain this AM. Denies SOB, fever, chills, nausea, vomiting, diaphoresis, abdominal pain, diarrhea or constipation. BP still high today w/ systolic in the 150's to 180's. Patient says this is what she normally runs at home when she is compliant with her meds. Otherwise, vitals are normal. Patient ready for discharge today. She will follow-up in our clinic next Tuesday.  Objective: Vital signs in last 24 hours: Filed Vitals:   08/29/12 2300 08/30/12 0547 08/30/12 0801 08/30/12 1154  BP: 180/76 168/69 178/84 166/82  Pulse:  59 69 68  Temp:  98.1 F (36.7 C) 98.4 F (36.9 C) 98.2 F (36.8 C)  TempSrc:  Oral    Resp:  18 18 20   Height:      Weight:  178 lb 3.2 oz (80.831 kg)    SpO2:  100% 100% 100%   Weight change: -6 lb 12.8 oz (-3.084 kg)  Intake/Output Summary (Last 24 hours) at 08/30/12 1628 Last data filed at 08/30/12 1200  Gross per 24 hour  Intake    480 ml  Output      0 ml  Net    480 ml   Physical Exam: General: Alert, cooperative, and in no apparent distress HEENT: Vision grossly intact, oropharynx clear and non-erythematous  Neck: Full range of motion without pain, supple, no lymphadenopathy or carotid bruits Lungs: Clear to ascultation bilaterally, normal work of respiration, no wheezes, rales, ronchi Heart: Regular rate and rhythm, no murmurs, gallops, or rubs Abdomen: Soft, non-tender, non-distended, normal bowel sounds Extremities: No cyanosis, clubbing, or edema Neurologic: Alert & oriented X3, cranial nerves II-XII  intact, strength grossly intact, sensation intact to light touch  Lab Results: Basic Metabolic Panel:  Recent Labs Lab 08/28/12 0950 08/29/12 0735  NA 137 139  K 4.0 3.9  CL 100 102  CO2 25 29  GLUCOSE 137* 122*  BUN 19 17  CREATININE 0.76 0.79  CALCIUM 9.9 9.8   Liver Function Tests:  Recent Labs Lab 08/28/12 0950  AST 19  ALT 20  ALKPHOS 70  BILITOT 0.4  PROT 7.3  ALBUMIN 3.8   CBC:  Recent Labs Lab 08/28/12 0950  WBC 5.8  NEUTROABS 2.8  HGB 13.4  HCT 38.8  MCV 86.2  PLT 249   Cardiac Enzymes:  Recent Labs Lab 08/28/12 0950 08/28/12 1230 08/28/12 1903 08/29/12 0010  CKTOTAL  --  209*  --   --   CKMB  --  4.3*  --   --   TROPONINI <0.30 <0.30 <0.30 <0.30   BNP: No results found for this basename: PROBNP,  in the last 168 hours D-Dimer: No results found for this basename: DDIMER,  in the last 168 hours CBG:  Recent Labs Lab 08/29/12 0744 08/29/12 1207 08/29/12 1631 08/29/12 2102 08/30/12 0759 08/30/12 1118  GLUCAP 129* 103* 96 119* 114* 115*   Hemoglobin A1C:  Recent Labs Lab 08/28/12 1227  HGBA1C 6.4*   Fasting Lipid Panel:  Recent Labs Lab 08/29/12 1443  CHOL 180  HDL 54  LDLCALC 110*  TRIG  80  CHOLHDL 3.3    Coagulation:  Recent Labs Lab 08/28/12 1227  LABPROT 12.6  INR 0.96   Urine Drug Screen: Drugs of Abuse     Component Value Date/Time   LABOPIA NONE DETECTED 08/28/2012 1445   COCAINSCRNUR NONE DETECTED 08/28/2012 1445   LABBENZ NONE DETECTED 08/28/2012 1445   AMPHETMU NONE DETECTED 08/28/2012 1445   THCU POSITIVE* 08/28/2012 1445   LABBARB NONE DETECTED 08/28/2012 1445   Studies/Results: No results found. Medications: I have reviewed the patient's current medications. Scheduled Meds: . aspirin EC  81 mg Oral Daily  . atorvastatin  80 mg Oral q1800  . heparin  5,000 Units Subcutaneous Q8H  . hydrochlorothiazide  25 mg Oral Daily  . insulin aspart  0-9 Units Subcutaneous TID WC  . lisinopril  20 mg Oral  Daily  . metoprolol succinate  100 mg Oral Daily  . sodium chloride  3 mL Intravenous Q12H   Continuous Infusions:  PRN Meds:.hydrALAZINE, morphine injection, nitroGLYCERIN  Assessment/Plan:  #Demand ischemia 2/2 hypertensive episode vs non-cardiac related chest pain- [name] is a 57 y/o female w/ PMHx of HTN and DM presents to the ED w/ complaints of chest pain for the past 3 days. On arrival to the ED, the patient's blood pressure was very high w/ systolic in the 220's. The patient admits to recent non-compliance to HTN medications because she is in between PCP's and did not have any refills. Usually takes metoprolol 100 mg bid and prinzide 20-12.5 qd, but has not taken BP meds for 1 month. Chest pain most likely d/t demand ischemia b/c of hypertensive episode. -Troponins negative x3.  -ECG shows twi's in inferior and lateral leads yesterday, but inferior inversions corrected. -Started on Metoprolol 100 mg qd, Lisinopril 20 mg, and HCTZ 25 mg. -Continue ASA 81 mg  -Continue Lipitor 80 mg. Lipid panel shows LDL of 110. Start on Lipitor 40 mg qhs at home.  #Hypetensive Urgency- Patient presented to the ED w/ systolic BP in the 220's and has been non-compliant w/ her BP meds at home.  -Started on Metoprolol 100 mg qd, Lisinopril 20 mg, and HCTZ 25 mg. -Plan to optimize hypertensive meds as an outpatient.  #DM  -On sensitive insulin SS coverage.  -Metformin held. Continue as an outpatient.  #DVT PPx: Heparin  Dispo: Disposition is deferred at this time, awaiting improvement of current medical problems.  Anticipated discharge today.   The patient does not have a current PCP (Pcp Not In System) and does need an [facility] hospital follow-up appointment after discharge.  The patient does not have transportation limitations that hinder transportation to clinic appointments.  .Services Needed at time of discharge: Y = Yes, Blank = No PT: N  OT: N  RN: N  Equipment: N    Other: N    LOS: 2 days   Lars Masson, MD 08/30/2012, 4:28 PM Pager: [phone]

## 2012-08-30 NOTE — Progress Notes (Signed)
  Date: 08/30/2012  Patient name: Leslie White  Medical record number: [id number]  Date of birth: 11-Feb-1955   This patient has been seen and the plan of care was discussed with the house staff. Please see their note for complete details. I concur with their findings with the following additions/corrections:  Agree with plan that Dr. Yetta Barre has outlined for discharge  Judyann Munson, MD 08/30/2012, 4:26 PM

## 2012-08-30 NOTE — Progress Notes (Signed)
Pt SBP 176-180 this pm.  Pt denies any complaints of headache or chest pain.  MD notified with no new orders.  Will continue to monitor pt closely.

## 2012-08-31 ENCOUNTER — Other Ambulatory Visit: Payer: Self-pay | Admitting: *Deleted

## 2012-08-31 MED ORDER — METOPROLOL TARTRATE 100 MG PO TABS: 100.0000 mg | ORAL_TABLET | Freq: Two times a day (BID) | ORAL | Status: DC

## 2012-08-31 NOTE — Telephone Encounter (Signed)
Pt states she's in between doctors;discharged from hospital yesterday; has a f/u appt next week, which she said she will keep. Needs an refill until her appt. Thanks

## 2012-09-02 NOTE — Discharge Summary (Signed)
Agree with plan as outlined by Dr. Yetta Barre. Please see details as listed in the daily progress note associated with discharge.

## 2012-09-06 ENCOUNTER — Ambulatory Visit: Payer: BC Managed Care – PPO | Admitting: Internal Medicine

## 2012-09-07 ENCOUNTER — Encounter: Payer: Self-pay | Admitting: Internal Medicine

## 2012-09-07 ENCOUNTER — Ambulatory Visit (INDEPENDENT_AMBULATORY_CARE_PROVIDER_SITE_OTHER): Payer: BC Managed Care – PPO | Admitting: Internal Medicine

## 2012-09-07 VITALS — BP 185/87 | HR 68 | Temp 97.5°F | Ht 61.0 in | Wt 187.1 lb

## 2012-09-07 DIAGNOSIS — I10 Essential (primary) hypertension: Secondary | ICD-10-CM

## 2012-09-07 DIAGNOSIS — E119 Type 2 diabetes mellitus without complications: Secondary | ICD-10-CM

## 2012-09-07 MED ORDER — AMLODIPINE BESYLATE 5 MG PO TABS: 5.0000 mg | ORAL_TABLET | Freq: Every day | ORAL | Status: DC

## 2012-09-07 MED ORDER — LISINOPRIL 30 MG PO TABS: 30.0000 mg | ORAL_TABLET | Freq: Every day | ORAL | Status: DC

## 2012-09-07 MED ORDER — METOPROLOL TARTRATE 100 MG PO TABS: 100.0000 mg | ORAL_TABLET | Freq: Two times a day (BID) | ORAL | Status: DC

## 2012-09-07 MED ORDER — METFORMIN HCL 850 MG PO TABS: 850.0000 mg | ORAL_TABLET | Freq: Two times a day (BID) | ORAL | Status: DC
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
## 2012-09-07 NOTE — Patient Instructions (Addendum)
Please follow-up in one month with your new PCP.  We are increasing your lisinopril to 30mg  daily.  As we discussed, you can take 1.5 tabs of your current lisinopril dose (20mg ) until you run out. We are also adding another medicine called amlodipine.  These medicines and refills of your other medicines should be waiting at your pharmacy.   Please check your blood pressure at work every day or every other day and record the values in your blood pressure log.  Be sure to bring this log and your medicines to your next visit.  Also remember to check your blood pressure only after you have been resting for at least 5 minutes.   We discussed symptoms of low heart rate today including lightheadedness, weakness, feeling confused (more information below).  Please let us know if you experience any of these.   Keep up the good work on your diabetes!  Diabetes Meal Planning Guide The diabetes meal planning guide is a tool to help you plan your meals and snacks. It is important for people with diabetes to manage their blood glucose (sugar) levels. Choosing the right foods and the right amounts throughout your day will help control your blood glucose. Eating right can even help you improve your blood pressure and reach or maintain a healthy weight. CARBOHYDRATE COUNTING MADE EASY When you eat carbohydrates, they turn to sugar. This raises your blood glucose level. Counting carbohydrates can help you control this level so you feel better. When you plan your meals by counting carbohydrates, you can have more flexibility in what you eat and balance your medicine with your food intake. Carbohydrate counting simply means adding up the total amount of carbohydrate grams in your meals and snacks. Try to eat about the same amount at each meal. Foods with carbohydrates are listed below. Each portion below is 1 carbohydrate serving or 15 grams of carbohydrates. Ask your dietician how many grams of carbohydrates you should  eat at each meal or snack. Grains and Starches  1 slice bread.   English muffin or hotdog/hamburger bun.   cup cold cereal (unsweetened).   cup cooked pasta or rice.   cup starchy vegetables (corn, potatoes, peas, beans, winter squash).  1 tortilla (6 inches).   bagel.  1 waffle or pancake (size of a CD).   cup cooked cereal.  4 to 6 small crackers. *Whole grain is recommended. Fruit  1 cup fresh unsweetened berries, melon, papaya, pineapple.  1 small fresh fruit.   banana or mango.   cup fruit juice (4 oz unsweetened).   cup canned fruit in natural juice or water.  2 tbs dried fruit.  12 to 15 grapes or cherries. Milk and Yogurt  1 cup fat-free or 1% milk.  1 cup soy milk.  6 oz light yogurt with sugar-free sweetener.  6 oz low-fat soy yogurt.  6 oz plain yogurt. Vegetables  1 cup raw or  cup cooked is counted as 0 carbohydrates or a "free" food.  If you eat 3 or more servings at 1 meal, count them as 1 carbohydrate serving. Other Carbohydrates   oz chips or pretzels.   cup ice cream or frozen yogurt.   cup sherbet or sorbet.  2 inch square cake, no frosting.  1 tbs honey, sugar, jam, jelly, or syrup.  2 small cookies.  3 squares of graham crackers.  3 cups popcorn.  6 crackers.  1 cup broth-based soup.  Count 1 cup casserole or other mixed foods  as 2 carbohydrate servings.  Foods with less than 20 calories in a serving may be counted as 0 carbohydrates or a "free" food. You may want to purchase a book or computer software that lists the carbohydrate gram counts of different foods. In addition, the nutrition facts panel on the labels of the foods you eat are a good source of this information. The label will tell you how big the serving size is and the total number of carbohydrate grams you will be eating per serving. Divide this number by 15 to obtain the number of carbohydrate servings in a portion. Remember, 1 carbohydrate  serving equals 15 grams of carbohydrate. SERVING SIZES Measuring foods and serving sizes helps you make sure you are getting the right amount of food. The list below tells how big or small some common serving sizes are.  1 oz.........4 stacked dice.  3 oz........Marland KitchenDeck of cards.  1 tsp.......Marland KitchenTip of little finger.  1 tbs......Marland KitchenMarland KitchenThumb.  2 tbs.......Marland KitchenGolf ball.   cup......Marland KitchenHalf of a fist.  1 cup.......Marland KitchenA fist. SAMPLE DIABETES MEAL PLAN Below is a sample meal plan that includes foods from the grain and starches, dairy, vegetable, fruit, and meat groups. A dietician can individualize a meal plan to fit your calorie needs and tell you the number of servings needed from each food group. However, controlling the total amount of carbohydrates in your meal or snack is more important than making sure you include all of the food groups at every meal. You may interchange carbohydrate containing foods (dairy, starches, and fruits). The meal plan below is an example of a 2000 calorie diet using carbohydrate counting. This meal plan has 17 carbohydrate servings. Breakfast  1 cup oatmeal (2 carb servings).   cup light yogurt (1 carb serving).  1 cup blueberries (1 carb serving).   cup almonds. Snack  1 large apple (2 carb servings).  1 low-fat string cheese stick. Lunch  Chicken breast salad.  1 cup spinach.   cup chopped tomatoes.  2 oz chicken breast, sliced.  2 tbs low-fat [place] dressing.  12 whole-wheat crackers (2 carb servings).  12 to 15 grapes (1 carb serving).  1 cup low-fat milk (1 carb serving). Snack  1 cup carrots.   cup hummus (1 carb serving). Dinner  3 oz broiled salmon.  1 cup brown rice (3 carb servings). Snack  1  cups steamed broccoli (1 carb serving) drizzled with 1 tsp olive oil and lemon juice.  1 cup light pudding (2 carb servings). DIABETES MEAL PLANNING WORKSHEET Your dietician can use this worksheet to help you decide how many  servings of foods and what types of foods are right for you.  BREAKFAST Food Group and Servings / Carb Servings Grain/Starches __________________________________ Dairy __________________________________________ Vegetable ______________________________________ Fruit ___________________________________________ Meat __________________________________________ Fat ____________________________________________ LUNCH Food Group and Servings / Carb Servings Grain/Starches ___________________________________ Dairy ___________________________________________ Fruit ____________________________________________ Meat ___________________________________________ Fat _____________________________________________ Laural Golden Food Group and Servings / Carb Servings Grain/Starches ___________________________________ Dairy ___________________________________________ Fruit ____________________________________________ Meat ___________________________________________ Fat _____________________________________________ SNACKS Food Group and Servings / Carb Servings Grain/Starches ___________________________________ Dairy ___________________________________________ Vegetable _______________________________________ Fruit ____________________________________________ Meat ___________________________________________ Fat _____________________________________________ DAILY TOTALS Starches _________________________ Vegetable ________________________ Fruit ____________________________ Dairy ____________________________ Meat ____________________________ Fat ______________________________ Document Released: 10/09/2004 Document Revised: 04/06/2011 Document Reviewed: 08/20/2008 [employer] Patient Information 2014 [employer]  Bradycardia You have a slow heart rate. This is called bradycardia. At rest, the normal heart rate is between 60-100 beats per minute. A slow heart may cause weakness, dizziness, loss of  consciousness, and  shortness of breath. This gets worse when you are active.  The medical causes of bradycardia can include:  Heart and thyroid problems.  High potassium.  Side effects of some medicines. Well-trained athletes may have heart rates as slow as 42 beats per minute. Evaluation of bradycardia may require an electrocardiogram (ECG), blood tests, and possibly other heart studies.  Bradycardia due to heart disease can be a serious problem. Damage to the heart's electrical system may require a temporary or permanent pacemaker. Beta-blocker drugs, digoxin, and other medicines used to control blood pressure and heart rhythms will also slow the heart. These medicines may need to be used in lower doses, or be stopped, if you have problems from the bradycardia.  SEEK IMMEDIATE MEDICAL CARE IF:   You develop fainting, extreme weakness, shortness of breath, or fever.  You develop severe chest or abdominal pain, repeated vomiting, or dehydration.  You become sweaty and weak. MAKE SURE YOU:   Understand these instructions.  Will watch your condition.  Will get help right away if you are not doing well or get worse. Document Released: 01/12/2005 Document Revised: 04/06/2011 Document Reviewed: 04/11/2008 [facility] Patient Information 2014 [place], [place].
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
## 2012-09-07 NOTE — Assessment & Plan Note (Addendum)
BP Readings from Last 3 Encounters:  09/07/12 185/87  08/30/12 166/82    Lab Results  Component Value Date   NA 139 08/29/2012   K 3.9 08/29/2012   CREATININE 0.79 08/29/2012    Assessment: Pt's BP is quite elevated today, 206/86 with HR 67, repeat 185/87 with HR 68. Her discharge BP was 166/82. Pt states she has had good medication compliance with no missed doses since her hospital discharge on 8/3.  She brought all of her medicines to clinic today and was encouraged to continue doing so.  She gets her medications at work and cost is not an issue, especially with her employee discount.  Her HR shows appropriate B-blockade, supporting medication compliance.  Blood pressure control: severely elevated Progress toward BP goal:  deteriorated  Plan: Increase dose of lisinopril to 30 mg daily, add amlodipine 5 mg daily.  Pt will take 1.5 tablets of her current lisinopril 20mg  dose until she runs out.  We discussed at length pt checking her blood pressure daily or every other day. Pt works at [employer] and can check her blood pressure during break or lunch on machine at the pharmacy there; emphasized that she needs to be resting for 5-10 minutes before measuring.  She will keep her BP log in her locker at work, record all of her values, and bring in to her next visit.  She is highly motivated to get her BP under control as she has a new grandson that she wants to watch grow up.  Pt was instructed to call our clinic if she has more than one BP reading with systolic >200 or if she has symptomatic bradycardia (given addition of amlodipine to regimen today with already B-blocked HR in 60s).  She will follow-up with her new PCP in one month (pt needs to come to clinic on Wednesdays since that is her day off).  If BP not controlled on current regimen with good medication compliance, would strongly consider workup of secondary hypertension.   Medications:  continue current medications- increased dose of lisinopril to 30mg   daily, added amlodipine 5 mg daily Self management tools provided:  BP log book

## 2012-09-08 NOTE — Progress Notes (Signed)
I saw and evaluated the patient.  I personally confirmed the key portions of the history and exam documented by Dr. Rogers and I reviewed pertinent patient test results.  The assessment, diagnosis, and plan were formulated together and I agree with the documentation in the resident's note. 

## 2012-10-18 ENCOUNTER — Encounter: Payer: Self-pay | Admitting: Internal Medicine

## 2012-10-19 ENCOUNTER — Encounter: Payer: Self-pay | Admitting: Internal Medicine

## 2012-10-19 ENCOUNTER — Ambulatory Visit (INDEPENDENT_AMBULATORY_CARE_PROVIDER_SITE_OTHER): Payer: BC Managed Care – PPO | Admitting: Internal Medicine

## 2012-10-19 VITALS — BP 140/80 | HR 70 | Temp 97.6°F | Ht 61.0 in | Wt 190.7 lb

## 2012-10-19 DIAGNOSIS — I10 Essential (primary) hypertension: Secondary | ICD-10-CM

## 2012-10-19 DIAGNOSIS — E119 Type 2 diabetes mellitus without complications: Secondary | ICD-10-CM

## 2012-10-19 DIAGNOSIS — R300 Dysuria: Secondary | ICD-10-CM

## 2012-10-19 DIAGNOSIS — Z0000 Encounter for general adult medical examination without abnormal findings: Secondary | ICD-10-CM

## 2012-10-19 LAB — GLUCOSE, CAPILLARY: Glucose-Capillary: 92 mg/dL (ref 70–99)

## 2012-10-19 LAB — URINALYSIS, ROUTINE W REFLEX MICROSCOPIC
Glucose, UA: NEGATIVE mg/dL
Hgb urine dipstick: NEGATIVE
Ketones, ur: NEGATIVE mg/dL
pH: 6.5 (ref 5.0–8.0)

## 2012-10-19 NOTE — Assessment & Plan Note (Addendum)
Controlled on metformin. Will continue with current dose of 850 mg twice a day. Will continue with ACE inhibitors, Lipitor and aspirin. No need for home monitoring.

## 2012-10-19 NOTE — Assessment & Plan Note (Addendum)
   Referral for screening colonoscopy and mammogram today.  Referral for diabetic eye exam today.  Patient declines tetanus and flu shot. She also declines pneumococcal shot. She feels she can get his performed at her place of work, which can be offered for free. Encouraged her to bring records if she gets those vaccines administered from her work. Gave her information about pneumococcal and influenza vaccination.  Will followup in 2 weeks to do a Pap smear since she was not prepared for it today.

## 2012-10-19 NOTE — Patient Instructions (Addendum)
General Instructions: Please check your blood pressure twice daily until I see you again  Please take your Novarsc at bed time  Please continue with the rest of your medications as before I will order a mammogram for your and a referral for both eye exam and for a colonoscopy  Please bring your medications on every visit  It was nice to meet you and I will see again in two weeks.    Treatment Goals:  Goals (1 Years of Data) as of 10/19/12         As of Today 09/07/12 09/07/12 08/30/12 08/30/12     Blood Pressure    . Blood Pressure < 140/90  181/76 185/87 206/86 166/82 178/84     Result Component    . HEMOGLOBIN A1C < 7.0            Progress Toward Treatment Goals:  Treatment Goal 10/19/2012  Hemoglobin A1C at goal  Blood pressure improved    Self Care Goals & Plans:    Home Blood Glucose Monitoring 10/19/2012  Check my blood sugar no home glucose monitoring  When to check my blood sugar -     Care Management & Community Referrals:  Referral 10/19/2012  Referrals made for care management support none needed

## 2012-10-19 NOTE — Assessment & Plan Note (Signed)
Clinical history and physical examination findings not very consistent with a urinary tract infection. On any symptoms of dysuria. No fevers, chills. Afebrile. No costovertebral angle tenderness. I do not suspect an STI, with the patient without vaginal discharge. Urine dipstick was normal.  Plan. -Urinalysis. I will call the patient with the results. -If urinalysis reveals a UTI, I will prescribe an antibiotic. -I will encourage the patient to take plenty of fluids.

## 2012-10-19 NOTE — Assessment & Plan Note (Addendum)
BP Readings from Last 3 Encounters:  10/19/12 140/80  09/07/12 185/87  08/30/12 166/82    Lab Results  Component Value Date   NA 139 08/29/2012   K 3.9 08/29/2012   CREATININE 0.79 08/29/2012    Assessment: Blood pressure control: controlled Progress toward BP goal:  improved Comments: Compliant with her medications. 1. Amlodipine 5 mg daily. 2. Lisinopril 30 mg daily. 3. Metoprolol 100 mg twice a day  Plan: Medications:  continue current medications Educational resources provided:   Self management tools provided:   Other plans: Urine microalbumin to creatinine ratio She will followup in 2 weeks. Showed a video for high blood pressure Encourage her to continue checking her blood pressures daily

## 2012-10-19 NOTE — Progress Notes (Signed)
Patient ID: Leslie White, female   DOB: November 16, 1955, 57 y.o.   MRN: [id number]   Subjective:   HPI: Leslie White is a 57 y.o. woman with past medical history of hypertension diabetes presents for routine followup visit.   She complains of mild burning sensation on passing urine for 3 days. No other constitutional or urinary symptoms. No vaginal discharge. She is only sexually active with her husband of many years.  Hypertension: She reports compliance with her medications, including amlodipine 5 mg daily, lisinopril 30 mg daily, and metoprolol 100 mg twice daily. She takes her amlodipine at lunchtime. She has not missed any doses. She also tells me that she now has a blood pressure cuff at home. Her home readings reveal a systolic blood pressure 120s to 140s and diastolics of 60s to 70s.  DM2,  Well controlled -  Lab Results  Component Value Date   HGBA1C 6.4* 08/28/2012   Patient checking blood sugars Does not check CBGs .  Currently taking metformin 850 mg twice a day. Patient misses doses 0 x per week on average.  0 hypoglycemic episodes since last visit. Denies assisted hypoglycemia or recently hospitalizations for either hyper or hypoglycemia. denies polyuria, polydipsia, nausea, vomiting, diarrhea.  does not request refills today.  In regards to diabetic complications: No known complications. Important diabetic medications: Is patient on aspirin? Yes Is patient on a statin? Yes Is patient on an ACE-I/ ARB? Yes    Past Medical History  Diagnosis Date  . Hypertension   . Diabetes mellitus without complication    Current Outpatient Prescriptions  Medication Sig Dispense Refill  . amLODipine (NORVASC) 5 MG tablet Take 1 tablet (5 mg total) by mouth daily.  30 tablet  11  . aspirin 325 MG EC tablet Take 325 mg by mouth daily.      Marland Kitchen atorvastatin (LIPITOR) 80 MG tablet Take 0.5 tablets (40 mg total) by mouth daily at 6 PM.  90 tablet  3  . Ergocalciferol  (VITAMIN D2 PO) Take 1 capsule by mouth See admin instructions. 1.25mg . Take one capsule by mouth every two weeks. No specific day she takes it on.      . hydrochlorothiazide (HYDRODIURIL) 25 MG tablet Take 1 tablet (25 mg total) by mouth daily.  90 tablet  3  . lisinopril (PRINIVIL,ZESTRIL) 30 MG tablet Take 1 tablet (30 mg total) by mouth daily.  30 tablet  11  . metFORMIN (GLUCOPHAGE) 850 MG tablet Take 1 tablet (850 mg total) by mouth 2 (two) times daily with a meal.  60 tablet  11  . metoprolol (LOPRESSOR) 100 MG tablet Take 1 tablet (100 mg total) by mouth 2 (two) times daily.  60 tablet  0  . nitroGLYCERIN (NITROSTAT) 0.4 MG SL tablet Place 1 tablet (0.4 mg total) under the tongue every 5 (five) minutes as needed for chest pain.  30 tablet  0   No current facility-administered medications for this visit.   No family history on file. History   Social History  . Marital Status: Married    Spouse Name: N/A    Number of Children: N/A  . Years of Education: N/A   Social History Main Topics  . Smoking status: Former Smoker    Quit date: 09/08/1987  . Smokeless tobacco: None  . Alcohol Use: No  . Drug Use: No  . Sexual Activity: None   Other Topics Concern  . None   Social History Narrative  . None   Review  of Systems: Constitutional: Denies fever, chills, diaphoresis, appetite change and fatigue.  Respiratory: Denies SOB, DOE, cough, chest tightness, and wheezing.  Cardiovascular: No chest pain, palpitations and leg swelling.  Gastrointestinal: No abdominal pain, nausea, vomiting, bloody stools Genitourinary: +dysuria, no frequency, hematuria, or flank pain.  Musculoskeletal: No myalgias, back pain, joint swelling, arthralgias    Objective:  Physical Exam: Filed Vitals:   10/19/12 1331 10/19/12 1510  BP: 181/76 140/80  Pulse: 71 70  Temp: 97.6 F (36.4 C)   TempSrc: Oral   Height: 5\' 1"  (1.549 m)   Weight: 190 lb 11.2 oz (86.501 kg)   SpO2: 99%    General:   Obese.   No acute distress.  appears comfortable  Lungs: CTA bilaterally. Heart: RRR; no extra sounds or murmurs  Abdomen: Non-distended, normal BS, soft, nontender; no hepatosplenomegaly  Extremities: No pedal edema. No joint swelling or tenderness. Neurologic: Alert and oriented x3. No obvious neurologic deficits.  Assessment & Plan:  I have discussed my assessment and plan  with Dr. Dalphine Handing as detailed under problem based charting.

## 2012-10-20 LAB — MICROALBUMIN / CREATININE URINE RATIO: Microalb Creat Ratio: 27 mg/g (ref 0.0–30.0)

## 2012-10-31 ENCOUNTER — Other Ambulatory Visit: Payer: Self-pay | Admitting: Internal Medicine

## 2012-11-02 ENCOUNTER — Ambulatory Visit (INDEPENDENT_AMBULATORY_CARE_PROVIDER_SITE_OTHER): Payer: BC Managed Care – PPO | Admitting: Internal Medicine

## 2012-11-02 ENCOUNTER — Ambulatory Visit (INDEPENDENT_AMBULATORY_CARE_PROVIDER_SITE_OTHER): Payer: BC Managed Care – PPO | Admitting: Dietician

## 2012-11-02 ENCOUNTER — Encounter: Payer: Self-pay | Admitting: Dietician

## 2012-11-02 ENCOUNTER — Encounter: Payer: Self-pay | Admitting: Internal Medicine

## 2012-11-02 VITALS — BP 138/71 | HR 81 | Temp 97.9°F | Wt 193.0 lb

## 2012-11-02 DIAGNOSIS — E669 Obesity, unspecified: Secondary | ICD-10-CM

## 2012-11-02 DIAGNOSIS — Z0000 Encounter for general adult medical examination without abnormal findings: Secondary | ICD-10-CM

## 2012-11-02 DIAGNOSIS — E119 Type 2 diabetes mellitus without complications: Secondary | ICD-10-CM

## 2012-11-02 DIAGNOSIS — I10 Essential (primary) hypertension: Secondary | ICD-10-CM

## 2012-11-02 NOTE — Progress Notes (Signed)
Patient ID: Leslie White, female   DOB: [birth year], 57 y.o.   MRN: [id number]   Subjective:   HPI: [name] is a 57 y.o. woman with past medical history of hypertension diabetes presents for routine followup visit. She has no complaints.  She would like to have pap smear done today and also see Lupita Leash.  She reports compliance with her medications.   Kindly see the A&P for the status of the pt's chronic medical problems.  Past Medical History  Diagnosis Date  . Hypertension   . Diabetes mellitus without complication    Current Outpatient Prescriptions  Medication Sig Dispense Refill  . amLODipine (NORVASC) 5 MG tablet Take 1 tablet (5 mg total) by mouth daily.  30 tablet  11  . aspirin 325 MG EC tablet Take 325 mg by mouth daily.      Marland Kitchen atorvastatin (LIPITOR) 80 MG tablet Take 0.5 tablets (40 mg total) by mouth daily at 6 PM.  90 tablet  3  . Ergocalciferol (VITAMIN D2 PO) Take 1 capsule by mouth See admin instructions. 1.25mg . Take one capsule by mouth every two weeks. No specific day she takes it on.      . hydrochlorothiazide (HYDRODIURIL) 25 MG tablet Take 1 tablet (25 mg total) by mouth daily.  90 tablet  3  . lisinopril (PRINIVIL,ZESTRIL) 30 MG tablet Take 1 tablet (30 mg total) by mouth daily.  30 tablet  11  . metFORMIN (GLUCOPHAGE) 850 MG tablet Take 1 tablet (850 mg total) by mouth 2 (two) times daily with a meal.  60 tablet  11  . metoprolol (LOPRESSOR) 100 MG tablet TAKE ONE TABLET BY MOUTH TWICE A DAY  60 tablet  11  . nitroGLYCERIN (NITROSTAT) 0.4 MG SL tablet Place 1 tablet (0.4 mg total) under the tongue every 5 (five) minutes as needed for chest pain.  30 tablet  0   No current facility-administered medications for this visit.   No family history on file. History   Social History  . Marital Status: Married    Spouse Name: N/A    Number of Children: N/A  . Years of Education: N/A   Social History Main Topics  . Smoking status:  Former Smoker    Quit date: 09/08/1987  . Smokeless tobacco: None  . Alcohol Use: No  . Drug Use: No  . Sexual Activity: None   Other Topics Concern  . None   Social History Narrative  . None   Review of Systems: Constitutional: Denies fever, chills, diaphoresis, appetite change and fatigue.  Respiratory: Denies SOB, DOE, cough, chest tightness, and wheezing.  Cardiovascular: No chest pain, palpitations and leg swelling.  Gastrointestinal: No abdominal pain, nausea, vomiting, bloody stools Genitourinary: no dysuria today. no frequency, hematuria, or flank pain.  Musculoskeletal: No myalgias, back pain, joint swelling, arthralgias    Objective:  Physical Exam: Filed Vitals:   11/02/12 0955  BP: 138/71  Pulse: 81  Temp: 97.9 F (36.6 C)  TempSrc: Oral  Weight: 193 lb (87.544 kg)  SpO2: 99%   General:  Obese.   No acute distress.  appears comfortable  Lungs: CTA bilaterally. Heart: RRR; no extra sounds or murmurs  Abdomen: Non-distended, normal BS, soft, nontender; no hepatosplenomegaly  Extremities: No pedal edema. No joint swelling or tenderness. Neurologic: Alert and oriented x3. No obvious neurologic deficits.  Assessment & Plan:  I have discussed my assessment and plan  with Dr. Rogelia Boga as detailed under problem based charting.

## 2012-11-02 NOTE — Assessment & Plan Note (Signed)
BP Readings from Last 3 Encounters:  11/02/12 138/71  10/19/12 140/80  09/07/12 185/87    Lab Results  Component Value Date   NA 139 08/29/2012   K 3.9 08/29/2012   CREATININE 0.79 08/29/2012    Assessment: Blood pressure control: controlled Progress toward BP goal:  at goal Comments:  Plan: Medications:  continue current medications Educational resources provided: brochure Self management tools provided:   Other plans: excellent compliance. Continue with regimen as below 1.  amlodipine 5 mg daily. 2. hydrochlorothiazide 25 mg daily 3. Lisinopril 30 mg daily. 4. Metoprolol 100 mg twice a day.

## 2012-11-02 NOTE — Assessment & Plan Note (Signed)
Lab Results  Component Value Date   HGBA1C 6.4* 08/28/2012     Assessment: Diabetes control: good control (HgbA1C at goal) Progress toward A1C goal:  at goal Comments: no meter today. Will check once day before BF  Plan: Medications:  continue current medications Home glucose monitoring: Frequency: once a day Timing: before breakfast Instruction/counseling given: discussed the need for weight loss Educational resources provided: brochure Self management tools provided:   Other plans: well controlled.

## 2012-11-02 NOTE — Assessment & Plan Note (Signed)
Trying to loose wt. She has lost 3Lb over the past 2 weeks.   Plan  - referral to Lupita Leash today for more counseling

## 2012-11-02 NOTE — Assessment & Plan Note (Signed)
Performed pap smear today  Gave flu shot  Will fax eye exam report which was performed last month from outside facility She is scheduled for mammogram in [place]. Report will be faxed Next step will be a colonoscopy

## 2012-11-02 NOTE — Patient Instructions (Signed)
General Instructions: Please take your medications as prescribed  Please see our weight loss counselor today. Her name is Lupita Leash I will call with results from Pap-smear The nurse will give you my information in order to receive the results of colonoscopy and mammogram Please follow up with me in 3-4 months  Treatment Goals:  Goals (1 Years of Data) as of 11/02/12         As of Today 10/19/12 10/19/12 09/07/12 09/07/12     Blood Pressure    . Blood Pressure < 140/90  138/71 140/80 181/76 185/87 206/86     Result Component    . HEMOGLOBIN A1C < 7.0            Progress Toward Treatment Goals:  Treatment Goal 11/02/2012  Hemoglobin A1C at goal  Blood pressure at goal    Self Care Goals & Plans:  Self Care Goal 11/02/2012  Manage my medications take my medicines as prescribed; bring my medications to every visit; refill my medications on time  Eat healthy foods drink diet soda or water instead of juice or soda; eat more vegetables; eat foods that are low in salt; eat baked foods instead of fried foods    Home Blood Glucose Monitoring 11/02/2012  Check my blood sugar once a day  When to check my blood sugar before breakfast     Care Management & Community Referrals:  Referral 11/02/2012  Referrals made for care management support nutritionist; diabetes educator

## 2012-11-02 NOTE — Progress Notes (Signed)
Medical Nutrition Therapy:  Appt start time: 1030 end time:  1125.  Assessment:  Primary concerns today: Weight management. Wants to decrease her weight. Patient reports desired weight 170# and recently gained 20# that she'd like to take off.  Patient has good knowledge of food groups and some knowledge of carb content of foods.  Usual eating pattern includes 3 meals and 0-1 snacks per day. Has supportive spouse and daughter. Usual physical activity includes work at [employer]. Frequent foods include skim milk, whole grains, salads, fruits. .      Progress Towards Goal(s):  In progress.   Nutritional Diagnosis:  [place]-3.3 Overweight/obesity As related to excessive caloric intake .  As evidenced by patient report of large portions and calorically dense food choices. .    Intervention:  Nutrition education about balanced meal planning emphasizing consistent carb intake Health coaching used to assist patient in determining her problem, motivating reasons to address it,  finding possible solutions for her problems and setting smart goals for herself.  Monitoring/Evaluation:  Dietary intake, exercise, blood sugars, and body weight prn. Patient wants to try attaining her goals on her own and to call for assistance or schedule another appointment as needed.

## 2012-11-03 NOTE — Progress Notes (Signed)
Case discussed with Dr.Kazibwe at the time of the visit.  We reviewed the resident's history and exam and pertinent patient test results.  I agree with the assessment, diagnosis, and plan of care documented in the resident's note.    

## 2012-11-04 NOTE — Progress Notes (Signed)
Case discussed with Dr.Kazibwe at the time of the visit.  We reviewed the resident's history and exam and pertinent patient test results.  I agree with the assessment, diagnosis, and plan of care documented in the resident's note.    

## 2012-11-05 ENCOUNTER — Encounter: Payer: Self-pay | Admitting: Internal Medicine

## 2012-11-05 DIAGNOSIS — H35363 Drusen (degenerative) of macula, bilateral: Secondary | ICD-10-CM | POA: Insufficient documentation

## 2012-11-14 ENCOUNTER — Encounter: Payer: Self-pay | Admitting: *Deleted

## 2012-12-15 NOTE — Addendum Note (Signed)
Addended by: Neomia Dear on: 12/15/2012 01:43 PM   Modules accepted: Orders

## 2013-02-20 ENCOUNTER — Other Ambulatory Visit (HOSPITAL_COMMUNITY): Payer: Self-pay | Admitting: Internal Medicine

## 2013-02-20 NOTE — Telephone Encounter (Signed)
Pls sch routine CC appt Dr Kirtland BouchardK next 3 months or so. Was supposed to F/U Jan 2015

## 2013-02-21 NOTE — Telephone Encounter (Signed)
Message to schedule appointment sent to front desk

## 2013-03-01 ENCOUNTER — Encounter: Payer: Self-pay | Admitting: Internal Medicine

## 2013-09-04 ENCOUNTER — Other Ambulatory Visit: Payer: Self-pay | Admitting: *Deleted

## 2013-09-05 MED ORDER — LISINOPRIL 30 MG PO TABS: 30.0000 mg | ORAL_TABLET | Freq: Every day | ORAL | Status: DC

## 2013-09-19 ENCOUNTER — Encounter: Payer: Self-pay | Admitting: Internal Medicine

## 2013-09-19 ENCOUNTER — Telehealth: Payer: Self-pay | Admitting: Internal Medicine

## 2013-09-19 NOTE — Telephone Encounter (Signed)
Attempted to contact patient this afternoon to get her scheduled for DM check.  Telephone number we have on file [phone] has been changed and/or disconnected.  Sending patient letter in the mail asking her to contact the clinic for an appt or to inform us if she has transferred her care to another practice.

## 2013-09-20 ENCOUNTER — Other Ambulatory Visit: Payer: Self-pay | Admitting: *Deleted

## 2013-09-20 MED ORDER — METFORMIN HCL 850 MG PO TABS: 850.0000 mg | ORAL_TABLET | Freq: Two times a day (BID) | ORAL | Status: DC

## 2013-09-20 NOTE — Telephone Encounter (Signed)
Last visit 11/02/12 !!    ? refill

## 2013-09-22 NOTE — Telephone Encounter (Addendum)
Pharmacy called and I asked them to have the patient call us when she picks up medication.  Current phone is not working. I cancelled the 11 refills until pt is seen in clinic.  You can refill at that time if she keeps appointment. Is this okay with you?

## 2013-09-27 ENCOUNTER — Other Ambulatory Visit: Payer: Self-pay | Admitting: *Deleted

## 2013-09-28 ENCOUNTER — Other Ambulatory Visit: Payer: Self-pay | Admitting: Internal Medicine

## 2013-09-28 MED ORDER — AMLODIPINE BESYLATE 5 MG PO TABS: 5.0000 mg | ORAL_TABLET | Freq: Every day | ORAL | Status: DC

## 2013-10-03 ENCOUNTER — Other Ambulatory Visit: Payer: Self-pay | Admitting: *Deleted

## 2013-10-03 MED ORDER — AMLODIPINE BESYLATE 5 MG PO TABS: 5.0000 mg | ORAL_TABLET | Freq: Every day | ORAL | Status: DC

## 2013-10-03 NOTE — Telephone Encounter (Signed)
Pt will be coming to our clinic, please refill Appointment scheduled

## 2013-10-04 ENCOUNTER — Ambulatory Visit: Payer: BC Managed Care – PPO | Admitting: Internal Medicine

## 2013-10-11 ENCOUNTER — Encounter: Payer: Self-pay | Admitting: Internal Medicine

## 2013-10-11 ENCOUNTER — Ambulatory Visit (INDEPENDENT_AMBULATORY_CARE_PROVIDER_SITE_OTHER): Payer: BC Managed Care – PPO | Admitting: Internal Medicine

## 2013-10-11 VITALS — BP 167/78 | HR 66 | Temp 98.1°F | Ht 61.0 in | Wt 193.8 lb

## 2013-10-11 DIAGNOSIS — Z0000 Encounter for general adult medical examination without abnormal findings: Secondary | ICD-10-CM

## 2013-10-11 DIAGNOSIS — I10 Essential (primary) hypertension: Secondary | ICD-10-CM

## 2013-10-11 DIAGNOSIS — E119 Type 2 diabetes mellitus without complications: Secondary | ICD-10-CM | POA: Diagnosis not present

## 2013-10-11 LAB — BASIC METABOLIC PANEL WITH GFR
BUN: 19 mg/dL (ref 6–23)
CALCIUM: 9.7 mg/dL (ref 8.4–10.5)
CO2: 27 meq/L (ref 19–32)
CREATININE: 0.77 mg/dL (ref 0.50–1.10)
Chloride: 100 mEq/L (ref 96–112)
GFR, Est African American: >89 mL/min
GFR, Est Non African American: 85 mL/min
GLUCOSE: 104 mg/dL — AB (ref 70–99)
Potassium: 4.1 mEq/L (ref 3.5–5.3)
Sodium: 138 mEq/L (ref 135–145)

## 2013-10-11 LAB — POCT GLYCOSYLATED HEMOGLOBIN (HGB A1C): HEMOGLOBIN A1C: 7

## 2013-10-11 LAB — GLUCOSE, CAPILLARY: GLUCOSE-CAPILLARY: 139 mg/dL — AB (ref 70–99)

## 2013-10-11 MED ORDER — AMLODIPINE BESYLATE 10 MG PO TABS: 10.0000 mg | ORAL_TABLET | Freq: Every day | ORAL | Status: DC

## 2013-10-11 NOTE — Assessment & Plan Note (Addendum)
BP Readings from Last 3 Encounters:  10/11/13 167/78  11/02/12 138/71  10/19/12 140/80    Lab Results  Component Value Date   NA 139 08/29/2012   K 3.9 08/29/2012   CREATININE 0.79 08/29/2012    Assessment: Blood pressure control:  Uncontrolled Progress toward BP goal:   Not at goal Comments: Com[plint with meds- Amlodipine-  daily, HCTZ-  daily, Lisinopril-  daily, Metop-  BID. No dizziness or falls. Bp was initially- 193/70, on recheck- 157/70. Pt checks her BP at home, but has checked in about 1-2 months, but says its not been too bad, and not below 140 systolic, but says the diastolic was most times above 161.  Plan: Medications:  Will increase Amlodipine to  daily, as not much gain with increasing lisinopril form 30 to . Also cont HCTZ at , Metop  BID.  Educational resources provided: brochure Self management tools provided: home blood pressure logbook Other plans: Bmet today - See in 1-2 months

## 2013-10-11 NOTE — Patient Instructions (Addendum)
General Instructions: We will be doing some blood work on you today, if there is any abnormality you will be contacted.  We will increase one of your Bp medications called Norvasc form  to . Your new prescription will be  once  A day.  We will like to see you in 1 to 2 months for BP recheck.  Please bring your medicines with you each time you come to clinic.  Medicines may include prescription medications, over-the-counter medications, herbal remedies, eye drops, vitamins, or other pills.

## 2013-10-11 NOTE — Progress Notes (Signed)
Patient ID: Leslie White, female   DOB: 13-Nov-1955, 58 y.o.   MRN: [id number]   Subjective:   Patient ID: Leslie White female   DOB: Jan 04, 1956 58 y.o.   MRN: [id number]  HPI: [name] is a 58 y.o. with PMH of HTN, DM, Obesity presented today for routine follow up visit. Please see problem based charting for review of pts chronic medical conditions.  Past Medical History  Diagnosis Date  . Hypertension   . Diabetes mellitus without complication    Current Outpatient Prescriptions  Medication Sig Dispense Refill  . amLODipine (NORVASC) 10 MG tablet Take 1 tablet (10 mg total) by mouth daily.  60 tablet  1  . aspirin 325 MG EC tablet Take 325 mg by mouth daily.      Marland Kitchen atorvastatin (LIPITOR) 80 MG tablet Take 0.5 tablets (40 mg total) by mouth daily at 6 PM.  90 tablet  3  . Ergocalciferol (VITAMIN D2 PO) Take 1 capsule by mouth See admin instructions. 1.25mg . Take one capsule by mouth every two weeks. No specific day she takes it on.      . hydrochlorothiazide (HYDRODIURIL) 25 MG tablet TAKE ONE TABLET BY MOUTH ONCE A DAY  90 tablet  2  . lisinopril (PRINIVIL,ZESTRIL) 30 MG tablet Take 1 tablet (30 mg total) by mouth daily.  90 tablet  4  . metFORMIN (GLUCOPHAGE) 850 MG tablet Take 1 tablet (850 mg total) by mouth 2 (two) times daily with a meal.  60 tablet  11  . metoprolol (LOPRESSOR) 100 MG tablet TAKE ONE TABLET BY MOUTH TWICE A DAY  60 tablet  11  . nitroGLYCERIN (NITROSTAT) 0.4 MG SL tablet Place 1 tablet (0.4 mg total) under the tongue every 5 (five) minutes as needed for chest pain.  30 tablet  0   No current facility-administered medications for this visit.   No family history on file. History   Social History  . Marital Status: Married    Spouse Name: N/A    Number of Children: N/A  . Years of Education: N/A   Social History Main Topics  . Smoking status: Former Smoker    Quit date: 09/08/1987  . Smokeless tobacco: None  .  Alcohol Use: No  . Drug Use: No  . Sexual Activity: None   Other Topics Concern  . None   Social History Narrative  . None   Review of Systems: CONSTITUTIONAL- No Fever, weightloss, night sweat or change in appetite. SKIN- No Rash, colour changes or itching. HEAD- No Headache or dizziness. Mouth/throat- No Sorethroat, dentures, or bleeding gums. RESPIRATORY- No Cough or SOB. CARDIAC- No Palpitations, DOE, PND or chest pain. GI- No nausea, vomiting, diarrhoea, constipation, abd pain. URINARY- No Frequency, urgency, straining or dysuria. NEUROLOGIC- No Numbness, syncope, seizures or burning. [facility]- Denies depression or anxiety.  Objective:  Physical Exam: Filed Vitals:   10/11/13 0936 10/11/13 1021  BP: 193/70 167/78  Pulse: 69 66  Temp: 98.1 F (36.7 C)   TempSrc: Oral   Height:  (1.549 m)   Weight: 193 lb 12.8 oz (87.907 kg)   SpO2: 100%    GENERAL- alert, co-operative, appears as stated age, not in any distress. HEENT- Atraumatic, normocephalic, PERRL, EOMI, oral mucosa appears, no cervical LN enlargement, thyroid does not appear enlarged, neck supple. CARDIAC- RRR, no murmurs, rubs or gallops. RESP- Moving equal volumes of air, and clear to auscultation bilaterally, no wheezes or crackles. ABDOMEN- Soft, nontender, no guarding or rebound, no palpable  masses or organomegaly, bowel sounds present. BACK- Normal curvature of the spine. NEURO- No obvious Cr N abnormality, strenght upper and lower extremities- normal, Gait- Normal. EXTREMITIES- pulse 2+, symmetric, no pedal edema, no calf tenderness. SKIN- Warm, dry, No rash or lesion. PSYCH- Normal mood and affect, appropriate thought content and speech.  Assessment & Plan:  The patient's case and plan of care was discussed with attending physician, Dr. Cyndie Chime.  Please see problem based charting for assessment and plan.

## 2013-10-11 NOTE — Assessment & Plan Note (Signed)
Refused Flu shot 

## 2013-10-11 NOTE — Assessment & Plan Note (Addendum)
Lab Results  Component Value Date   HGBA1C 7.0 10/11/2013   HGBA1C 6.4* 08/28/2012     Assessment: Diabetes control:  Acceptable Progress toward A1C goal:   Increased mildly compared to prior Comments: Complaint with Metformin  BID  Plan: Medications:  continue current medications Home glucose monitoring: No monitoring.  Instruction/counseling given: discussed the need for weight loss and discussed diet Educational resources provided: brochure Self management tools provided:   Other plans: Discussed extensively about weight loss, exercise- pt say she occasionally swims, encouraged her to cont this with walks, also dicussed the importance of diet. Pt says she knows what to do, and is willing to try it again, and says her problem is consistency. - Will not increase metformin at this time, consider increasing if HgBa1c continues to rise. - Pt is also on Lipitor-  daily, which she says she has hardly taken since it was prescribed. She say she has never gotten it refilled. Doesn't like to take medications unless really indicated, and that her cholesterol numbers are not bad. Explained the importance of taking statin, reduction in risk of heart attacks and strokes especially as she is diabetic and not so much for the numbers. Pt is afraid of liver toxicity. Pt at this time says she doesnt want to cont taking lipitor.

## 2013-10-12 NOTE — Progress Notes (Signed)
Medicine attending: Presenting problems, physical exam, and medications, reviewed with resident physician Dr. Mariea Clonts and I concur with her evaluation and management plan. Cephas Darby, M.D., FACP

## 2013-11-20 ENCOUNTER — Other Ambulatory Visit (HOSPITAL_COMMUNITY): Payer: Self-pay | Admitting: Internal Medicine

## 2013-12-05 ENCOUNTER — Other Ambulatory Visit: Payer: Self-pay | Admitting: Internal Medicine

## 2014-02-05 ENCOUNTER — Telehealth: Payer: Self-pay | Admitting: *Deleted

## 2014-02-05 NOTE — Telephone Encounter (Signed)
Pt called with c/o constipation for a few weeks.  She is having BM's but she has to sit a long time. Some BM's are soft but some are not. Her diet has not changed.  No abdominal pain.  Pt did take a laxative last week that worked for her, she drinks lots of water, good diet.  Appointment given  for 1/20 if she still has the problem. This is the first day she can come in.  Please advise Pt # [phone]

## 2014-02-05 NOTE — Telephone Encounter (Signed)
I tried to call patient back but no answer. I would recommend OTC laxatives like Miralax to see if this helps.  Otherwise she is free to come for evaluation at the clinic if she prefers that.

## 2014-02-06 NOTE — Telephone Encounter (Signed)
Pt has been inform.

## 2014-02-14 ENCOUNTER — Ambulatory Visit: Payer: Self-pay | Admitting: Internal Medicine

## 2014-05-14 ENCOUNTER — Other Ambulatory Visit: Payer: Self-pay | Admitting: Internal Medicine

## 2014-05-21 ENCOUNTER — Other Ambulatory Visit: Payer: Self-pay | Admitting: Internal Medicine

## 2014-06-12 ENCOUNTER — Encounter: Payer: Self-pay | Admitting: *Deleted

## 2014-08-06 ENCOUNTER — Other Ambulatory Visit: Payer: Self-pay | Admitting: Internal Medicine

## 2014-08-09 NOTE — Telephone Encounter (Signed)
Not seen since Sep and BP high at that time and med increased but no F/U. Must be seen for more meds. Pls sch PCP or [facility] appt 30 days HTN F/U

## 2014-08-09 NOTE — Telephone Encounter (Signed)
Message sent to front desk to schedule.

## 2014-08-13 ENCOUNTER — Other Ambulatory Visit: Payer: Self-pay | Admitting: Internal Medicine

## 2014-08-14 ENCOUNTER — Encounter: Payer: Self-pay | Admitting: Internal Medicine

## 2014-08-22 ENCOUNTER — Encounter: Payer: Self-pay | Admitting: Internal Medicine

## 2014-08-22 ENCOUNTER — Ambulatory Visit (INDEPENDENT_AMBULATORY_CARE_PROVIDER_SITE_OTHER): Payer: BLUE CROSS/BLUE SHIELD | Admitting: Internal Medicine

## 2014-08-22 VITALS — BP 156/69 | HR 74 | Temp 98.0°F | Ht 61.0 in | Wt 194.9 lb

## 2014-08-22 DIAGNOSIS — Z0000 Encounter for general adult medical examination without abnormal findings: Secondary | ICD-10-CM

## 2014-08-22 DIAGNOSIS — E669 Obesity, unspecified: Secondary | ICD-10-CM

## 2014-08-22 DIAGNOSIS — E119 Type 2 diabetes mellitus without complications: Secondary | ICD-10-CM

## 2014-08-22 DIAGNOSIS — Z6836 Body mass index (BMI) 36.0-36.9, adult: Secondary | ICD-10-CM

## 2014-08-22 DIAGNOSIS — I10 Essential (primary) hypertension: Secondary | ICD-10-CM

## 2014-08-22 LAB — POCT GLYCOSYLATED HEMOGLOBIN (HGB A1C): HEMOGLOBIN A1C: 7.5

## 2014-08-22 LAB — GLUCOSE, CAPILLARY: Glucose-Capillary: 103 mg/dL — ABNORMAL HIGH (ref 65–99)

## 2014-08-22 MED ORDER — METFORMIN HCL 1000 MG PO TABS: 1000.0000 mg | ORAL_TABLET | Freq: Two times a day (BID) | ORAL | Status: DC

## 2014-08-22 MED ORDER — HYDROCHLOROTHIAZIDE 25 MG PO TABS: 25.0000 mg | ORAL_TABLET | Freq: Every day | ORAL | Status: DC

## 2014-08-22 MED ORDER — LISINOPRIL 30 MG PO TABS: 30.0000 mg | ORAL_TABLET | Freq: Every day | ORAL | Status: DC

## 2014-08-22 MED ORDER — AMLODIPINE BESYLATE 10 MG PO TABS: 10.0000 mg | ORAL_TABLET | Freq: Every day | ORAL | Status: DC

## 2014-08-22 MED ORDER — METOPROLOL TARTRATE 100 MG PO TABS: 100.0000 mg | ORAL_TABLET | Freq: Two times a day (BID) | ORAL | Status: DC

## 2014-08-22 NOTE — Patient Instructions (Signed)
Thank you for your visit today !  Please take 1000 mg metformin twice a day with meals  Please restart your hydrochlorothiazide tablets   Please have a colonoscopy done- the clinic will call you to set up an appointment  Please follow up in 3 months  Diabetes and Exercise Exercising regularly is important. It is not just about losing weight. It has many health benefits, such as:  Improving your overall fitness, flexibility, and endurance.  Increasing your bone density.  Helping with weight control.  Decreasing your body fat.  Increasing your muscle strength.  Reducing stress and tension.  Improving your overall health. People with diabetes who exercise gain additional benefits because exercise:  Reduces appetite.  Improves the body's use of blood sugar (glucose).  Helps lower or control blood glucose.  Decreases blood pressure.  Helps control blood lipids (such as cholesterol and triglycerides).  Improves the body's use of the hormone insulin by:  Increasing the body's insulin sensitivity.  Reducing the body's insulin needs.  Decreases the risk for heart disease because exercising:  Lowers cholesterol and triglycerides levels.  Increases the levels of good cholesterol (such as high-density lipoproteins [HDL]) in the body.  Lowers blood glucose levels. YOUR ACTIVITY PLAN  Choose an activity that you enjoy and set realistic goals. Your health care provider or diabetes educator can help you make an activity plan that works for you. Exercise regularly as directed by your health care provider. This includes:  Performing resistance training twice a week such as push-ups, sit-ups, lifting weights, or using resistance bands.  Performing 150 minutes of cardio exercises each week such as walking, running, or playing sports.  Staying active and spending no more than 90 minutes at one time being inactive. Even short bursts of exercise are good for you. Three 10-minute  sessions spread throughout the day are just as beneficial as a single 30-minute session. Some exercise ideas include:  Taking the dog for a walk.  Taking the stairs instead of the elevator.  Dancing to your favorite song.  Doing an exercise video.  Doing your favorite exercise with a friend. RECOMMENDATIONS FOR EXERCISING WITH TYPE 1 OR TYPE 2 DIABETES   Check your blood glucose before exercising. If blood glucose levels are greater than 240 mg/dL, check for urine ketones. Do not exercise if ketones are present.  Avoid injecting insulin into areas of the body that are going to be exercised. For example, avoid injecting insulin into:  The arms when playing tennis.  The legs when jogging.  Keep a record of:  Food intake before and after you exercise.  Expected peak times of insulin action.  Blood glucose levels before and after you exercise.  The type and amount of exercise you have done.  Review your records with your health care provider. Your health care provider will help you to develop guidelines for adjusting food intake and insulin amounts before and after exercising.  If you take insulin or oral hypoglycemic agents, watch for signs and symptoms of hypoglycemia. They include:  Dizziness.  Shaking.  Sweating.  Chills.  Confusion.  Drink plenty of water while you exercise to prevent dehydration or heat stroke. Body water is lost during exercise and must be replaced.  Talk to your health care provider before starting an exercise program to make sure it is safe for you. Remember, almost any type of activity is better than none. Document Released: 04/04/2003 Document Revised: 05/29/2013 Document Reviewed: 06/21/2012 [facility] Patient Information 2015 [place], [place]. This  information is not intended to replace advice given to you by your health care provider. Make sure you discuss any questions you have with your health care provider.  

## 2014-08-22 NOTE — Assessment & Plan Note (Addendum)
Lab Results  Component Value Date   HGBA1C 7.5 08/22/2014   HGBA1C 7.0 10/11/2013   HGBA1C 6.4* 08/28/2012    Her A1C has been consistently trending up , also did urine microalbumin She does not check her CBGs at home  I emphasized to her about diet and exercise- Pt says she does no exercise and does not really watch her diet.  Advised her that if the A1C is up at the next visit then we recommend adding glipizide as pt wanted to do another trial of lifestyle modification and increased metformin. Increased metformin from 850 mg bid to 1000 mg bid   Also she is not taking atorvastatin and I told her that diabetes is a risk factor for CAD development and she ought to be on a statin, and she will try to start it.

## 2014-08-22 NOTE — Assessment & Plan Note (Signed)
Body mass index is 36.85 kg/(m^2). Advised her that this was very obese and she will work on lifestyle modification

## 2014-08-22 NOTE — Assessment & Plan Note (Signed)
BP Readings from Last 3 Encounters:  08/22/14 156/69  10/11/13 167/78  11/02/12 138/71   She is on norvasc 10, hctz 25, lisinopril 30, and metoprolol 100 bid Her BP was mildly high today and pt has not taken hctz for few days  We refilled her meds and asked her to restart hctz and RTC in 3 months

## 2014-08-22 NOTE — Assessment & Plan Note (Signed)
Referred her to GI for colonoscopy- she is past due Advised her that she is due for mammogram in October

## 2014-08-22 NOTE — Progress Notes (Signed)
Patient ID: Leslie White, female   DOB: 31-Aug-1955, 59 y.o.   MRN: [id number]    Subjective:   Patient ID: Leslie White female   DOB: 08-16-1955 59 y.o.   MRN: [id number]  HPI: [name] is a 59 y.o. woman who is here after a year for medication refill, and management of her HTN, diabetes, and preventative health care. Please see problem list.    Past Medical History  Diagnosis Date  . Hypertension   . Diabetes mellitus without complication    Current Outpatient Prescriptions  Medication Sig Dispense Refill  . amLODipine (NORVASC) 10 MG tablet Take 1 tablet (10 mg total) by mouth daily. 30 tablet 0  . Ergocalciferol (VITAMIN D2 PO) Take 1 capsule by mouth See admin instructions. 1.25mg . Take one capsule by mouth every two weeks. No specific day she takes it on.    . lisinopril (PRINIVIL,ZESTRIL) 30 MG tablet Take 1 tablet (30 mg total) by mouth daily. 90 tablet 4  . metFORMIN (GLUCOPHAGE) 1000 MG tablet Take 1 tablet (1,000 mg total) by mouth 2 (two) times daily with a meal. 60 tablet 9  . metoprolol (LOPRESSOR) 100 MG tablet Take 1 tablet (100 mg total) by mouth 2 (two) times daily. 60 tablet 10  . atorvastatin (LIPITOR) 80 MG tablet Take 0.5 tablets (40 mg total) by mouth daily at 6 PM. (Patient not taking: Reported on 08/22/2014) 90 tablet 3  . hydrochlorothiazide (HYDRODIURIL) 25 MG tablet Take 1 tablet (25 mg total) by mouth daily. 90 tablet 0  . nitroGLYCERIN (NITROSTAT) 0.4 MG SL tablet Place 1 tablet (0.4 mg total) under the tongue every 5 (five) minutes as needed for chest pain. (Patient not taking: Reported on 08/22/2014) 30 tablet 0   No current facility-administered medications for this visit.   No family history on file. History   Social History  . Marital Status: Married    Spouse Name: N/A  . Number of Children: N/A  . Years of Education: N/A   Social History Main Topics  . Smoking status: Former Smoker    Quit date:  09/08/1987  . Smokeless tobacco: Not on file  . Alcohol Use: No  . Drug Use: No  . Sexual Activity: Not on file   Other Topics Concern  . None   Social History Narrative   Review of Systems: Review of Systems  Constitutional: Negative.  Negative for fever, chills and malaise/fatigue.  HENT: Negative.   Respiratory: Negative.   Cardiovascular: Negative.  Negative for chest pain and orthopnea.  Gastrointestinal: Negative.  Negative for heartburn, nausea, vomiting, abdominal pain and blood in stool.  Musculoskeletal: Negative.   Neurological: Negative.  Negative for weakness.  All other systems reviewed and are negative.   Objective:  Physical Exam: Filed Vitals:   08/22/14 1403  BP: 156/69  Pulse: 74  Temp: 98 F (36.7 C)  TempSrc: Oral  Height: 5\' 1"  (1.549 m)  Weight: 194 lb 14.4 oz (88.406 kg)  SpO2: 100%   Physical Exam  Constitutional: She is oriented to person, place, and time. She appears well-developed.  Obese in NAD  Neck: Normal range of motion. Neck supple. No thyromegaly present.  Cardiovascular: Normal rate, regular rhythm, normal heart sounds and intact distal pulses.   No murmur heard. Pulmonary/Chest: Effort normal and breath sounds normal. No respiratory distress.  Abdominal: Soft. Bowel sounds are normal. She exhibits no distension.  Musculoskeletal: Normal range of motion.  Neurological: She is alert and oriented to person, place, and time.  Skin: Skin is warm.    Assessment & Plan:   Please see problem based charting for assessment and plan

## 2014-08-23 LAB — MICROALBUMIN / CREATININE URINE RATIO
CREATININE, URINE: 196.9 mg/dL
MICROALB/CREAT RATIO: 26.9 mg/g (ref 0.0–30.0)
Microalb, Ur: 5.3 mg/dL — ABNORMAL HIGH (ref <2.0)

## 2014-08-23 NOTE — Progress Notes (Signed)
Medicine attending: I personally interviewed and briefly examined this patient, and reviewed pertinent clinical laboratory and radiographic data  with resident physician Dr.Parth Saraiya and we discussed a   management plan. 

## 2014-09-04 ENCOUNTER — Encounter: Payer: Self-pay | Admitting: *Deleted

## 2014-10-02 ENCOUNTER — Encounter: Payer: Self-pay | Admitting: Gastroenterology

## 2014-10-04 NOTE — Addendum Note (Signed)
Addended by: Neomia Dear on: 10/04/2014 07:44 PM   Modules accepted: Orders

## 2014-10-29 ENCOUNTER — Other Ambulatory Visit: Payer: Self-pay | Admitting: Internal Medicine

## 2014-10-29 MED ORDER — METFORMIN HCL 1000 MG PO TABS: 1000.0000 mg | ORAL_TABLET | Freq: Two times a day (BID) | ORAL | Status: DC

## 2014-11-12 ENCOUNTER — Other Ambulatory Visit: Payer: Self-pay | Admitting: Internal Medicine

## 2014-12-05 ENCOUNTER — Ambulatory Visit (INDEPENDENT_AMBULATORY_CARE_PROVIDER_SITE_OTHER): Payer: BLUE CROSS/BLUE SHIELD | Admitting: Internal Medicine

## 2014-12-05 ENCOUNTER — Encounter: Payer: Self-pay | Admitting: Internal Medicine

## 2014-12-05 VITALS — BP 160/64 | HR 66 | Temp 98.3°F | Ht 61.0 in | Wt 197.2 lb

## 2014-12-05 DIAGNOSIS — I10 Essential (primary) hypertension: Secondary | ICD-10-CM | POA: Diagnosis not present

## 2014-12-05 DIAGNOSIS — E119 Type 2 diabetes mellitus without complications: Secondary | ICD-10-CM

## 2014-12-05 DIAGNOSIS — Z0000 Encounter for general adult medical examination without abnormal findings: Secondary | ICD-10-CM | POA: Diagnosis not present

## 2014-12-05 LAB — POCT GLYCOSYLATED HEMOGLOBIN (HGB A1C): Hemoglobin A1C: 7.8

## 2014-12-05 LAB — GLUCOSE, CAPILLARY: Glucose-Capillary: 78 mg/dL (ref 65–99)

## 2014-12-05 MED ORDER — FLUTICASONE PROPIONATE 50 MCG/ACT NA SUSP: 2.0000 | Freq: Every day | NASAL | Status: DC

## 2014-12-05 MED ORDER — GLUCOSE BLOOD VI STRP: ORAL_STRIP | Status: AC

## 2014-12-05 MED ORDER — ONETOUCH DELICA LANCETS 33G MISC: Status: AC

## 2014-12-05 MED ORDER — PRAVASTATIN SODIUM 80 MG PO TABS: 80.0000 mg | ORAL_TABLET | Freq: Every evening | ORAL | Status: DC

## 2014-12-05 MED ORDER — HYDROCHLOROTHIAZIDE 25 MG PO TABS: 25.0000 mg | ORAL_TABLET | Freq: Every day | ORAL | Status: DC

## 2014-12-05 MED ORDER — HYDROCHLOROTHIAZIDE 25 MG PO TABS: 50.0000 mg | ORAL_TABLET | Freq: Every day | ORAL | Status: DC

## 2014-12-05 NOTE — Patient Instructions (Signed)
Thank you for your visit today Please check your blood pressures at home and jot down in a diary Please check the blood sugars at home and jot down- the supplies have been sent to pharmacy Please start pravastatin 40 mg daily Please follow up in 3 months

## 2014-12-05 NOTE — Assessment & Plan Note (Signed)
Prescribed pravastatin 40 mg daily -offered colonoscopy, Tdap, HIV screening and opthalmology exam, but patient deferred till next time.

## 2014-12-05 NOTE — Progress Notes (Signed)
Patient ID: Leslie White, female   DOB: April 11, 1955, 59 y.o.   MRN: [id number]    Subjective:   Patient ID: Leslie White female   DOB: April 11, 1955 59 y.o.   MRN: [id number]  HPI: [name] is a 59 y.o. woman with HTN, T2DM, who is here for 3 month follow up.  Please see Problem List/A&P for the status of the patient's chronic medical problems      Past Medical History  Diagnosis Date  . Hypertension   . Diabetes mellitus without complication [facility](HCC)    Current Outpatient Prescriptions  Medication Sig Dispense Refill  . amLODipine (NORVASC) 10 MG tablet TAKE ONE TABLET BY MOUTH DAILY 30 tablet 0  . Ergocalciferol (VITAMIN D2 PO) Take 1 capsule by mouth See admin instructions. 1.25mg . Take one capsule by mouth every two weeks. No specific day she takes it on.    . hydrochlorothiazide (HYDRODIURIL) 25 MG tablet Take 1 tablet (25 mg total) by mouth daily. 90 tablet 0  . lisinopril (PRINIVIL,ZESTRIL) 30 MG tablet Take 1 tablet (30 mg total) by mouth daily. 90 tablet 4  . metFORMIN (GLUCOPHAGE) 1000 MG tablet Take 1 tablet (1,000 mg total) by mouth 2 (two) times daily with a meal. 60 tablet 9  . metFORMIN (GLUCOPHAGE) 1000 MG tablet Take 1 tablet (1,000 mg total) by mouth 2 (two) times daily with a meal. 60 tablet 11  . metoprolol (LOPRESSOR) 100 MG tablet Take 1 tablet (100 mg total) by mouth 2 (two) times daily. 60 tablet 10  . fluticasone (FLONASE) 50 MCG/ACT nasal spray Place 2 sprays into both nostrils daily. 16 g 2  . glucose blood test strip Use as instructed 100 each 12  . nitroGLYCERIN (NITROSTAT) 0.4 MG SL tablet Place 1 tablet (0.4 mg total) under the tongue every 5 (five) minutes as needed for chest pain. (Patient not taking: Reported on 12/05/2014) 30 tablet 0  . ONETOUCH DELICA LANCETS 33G MISC Use as directed 100 each 2  . pravastatin (PRAVACHOL) 80 MG tablet Take 1 tablet (80 mg total) by mouth every evening. (Patient not taking:  Reported on 12/05/2014) 30 tablet 11   No current facility-administered medications for this visit.   No family history on file. Social History   Social History  . Marital Status: Married    Spouse Name: N/A  . Number of Children: N/A  . Years of Education: N/A   Social History Main Topics  . Smoking status: Former Smoker    Quit date: 09/08/1987  . Smokeless tobacco: None  . Alcohol Use: No  . Drug Use: No  . Sexual Activity: Not Asked   Other Topics Concern  . None   Social History Narrative   Review of Systems: Review of Systems  Constitutional: Negative.  Negative for fever, chills and weight loss.  HENT: Negative.   Respiratory: Negative.  Negative for shortness of breath.   Cardiovascular: Negative for chest pain, palpitations, orthopnea, claudication and leg swelling.  Gastrointestinal: Negative for nausea, vomiting, abdominal pain, diarrhea and constipation.  Musculoskeletal: Negative.   Neurological: Negative for tingling and sensory change.    Objective:  Physical Exam: Filed Vitals:   12/05/14 1405  BP: 160/64  Pulse: 66  Temp: 98.3 F (36.8 C)  TempSrc: Oral  Height: 5\' 1"  (1.549 m)  Weight: 197 lb 3.2 oz (89.449 kg)  SpO2: 100%   Physical Exam  Constitutional: She is oriented to person, place, and time. She appears well-developed and well-nourished.  HENT:  Head: Normocephalic  and atraumatic.  Eyes: EOM are normal. Pupils are equal, round, and reactive to light.  Neck: Normal range of motion. Neck supple. No thyromegaly present.  Cardiovascular: Normal rate, regular rhythm, normal heart sounds and intact distal pulses.   No murmur heard. Pulmonary/Chest: Effort normal and breath sounds normal. No respiratory distress. She has no wheezes.  Abdominal: Soft. Bowel sounds are normal. She exhibits no distension. There is no tenderness.  Musculoskeletal: Normal range of motion.  Neurological: She is alert and oriented to person, place, and time.    Skin: Skin is warm.  Psychiatric: She has a normal mood and affect.    Assessment & Plan:   Please see problem based charting for assessment and plan

## 2014-12-05 NOTE — Assessment & Plan Note (Signed)
Her A1c today was 7.8, slightly increased from 7.5 at last visit Patient only recently started taking metformin 1000 bid for 2 weeks now, previously she was taking the previously prescribed lower dose.  Patient does not check CBGs at home and does not have a meter. Her CBG today in office was 78.  Plan: Continue metformin 1000 bid Provided glucometer Onetouch- Ms Leslie White was present in the room and we went over how to use the meter. Also sent the strips and lancets to her pharmacy and patient indicated understanding of everything that we discussed and will bring a log next time.  I am holding off on adding glipizide as her CBG was 78 so I am not sure to what extent her CBGs range from and her glucometer will give a better idea. Counseled again on lifestyle modification F.u in 3 months

## 2014-12-05 NOTE — Progress Notes (Signed)
Medicine attending: I personally interviewed and briefly examined this patient, and reviewed pertinent clinical laboratory  data  with resident physician Dr.Parth Saraiya and we discussed a   management plan. 

## 2014-12-05 NOTE — Assessment & Plan Note (Signed)
BP Readings from Last 3 Encounters:  12/05/14 160/64  08/22/14 156/69  10/11/13 167/78   Pt's Bp was 16//64- She is on norvasc 10, hctz 25, lisinopril 30, and metoprolol 100 bid  Asked the patient to check BP at home and to have a log- she will bring log next time. Pt has a BP cuff at home. -May increase the hctz to 50 if her Bp is continually elevated and to explore secondary causes of hypertension Counseled on lifestyle modification and obesity

## 2014-12-06 LAB — BMP8+ANION GAP
ANION GAP: 18 mmol/L (ref 10.0–18.0)
BUN/Creatinine Ratio: 25 — ABNORMAL HIGH (ref 9–23)
BUN: 19 mg/dL (ref 6–24)
CO2: 24 mmol/L (ref 18–29)
Calcium: 10.3 mg/dL — ABNORMAL HIGH (ref 8.7–10.2)
Chloride: 99 mmol/L (ref 97–106)
Creatinine, Ser: 0.77 mg/dL (ref 0.57–1.00)
GFR calc Af Amer: 98 mL/min/{1.73_m2} (ref >59)
GFR calc non Af Amer: 85 mL/min/{1.73_m2} (ref >59)
Glucose: 79 mg/dL (ref 65–99)
POTASSIUM: 4.4 mmol/L (ref 3.5–5.2)
SODIUM: 141 mmol/L (ref 136–144)

## 2014-12-06 LAB — LIPID PANEL
CHOL/HDL RATIO: 4 ratio (ref 0.0–4.4)
Cholesterol, Total: 191 mg/dL (ref 100–199)
HDL: 48 mg/dL (ref >39)
LDL CALC: 125 mg/dL — AB (ref 0–99)
Triglycerides: 89 mg/dL (ref 0–149)
VLDL Cholesterol Cal: 18 mg/dL (ref 5–40)

## 2014-12-10 ENCOUNTER — Other Ambulatory Visit: Payer: Self-pay | Admitting: Internal Medicine

## 2015-01-14 ENCOUNTER — Other Ambulatory Visit: Payer: Self-pay | Admitting: Internal Medicine

## 2015-03-04 ENCOUNTER — Other Ambulatory Visit: Payer: Self-pay | Admitting: Internal Medicine

## 2015-03-04 NOTE — Telephone Encounter (Signed)
Hi Pt has not been seen here since November- at that time her blood pressure was continuing to be elevated  I am giving 30 day supply of amlodipine with no further refills, and pl ask pt to make an appt in the next month  Thanks

## 2015-03-05 NOTE — Telephone Encounter (Signed)
Will have patient scheduled.

## 2015-03-13 ENCOUNTER — Ambulatory Visit: Payer: BLUE CROSS/BLUE SHIELD | Admitting: Internal Medicine

## 2015-04-08 ENCOUNTER — Other Ambulatory Visit: Payer: Self-pay | Admitting: Internal Medicine

## 2015-04-08 NOTE — Telephone Encounter (Signed)
Was to F/U Feb but cancelled appt. Dr Kathie RhodesS has no appt so [facility] HTN F/U next 30 days.

## 2015-04-09 ENCOUNTER — Encounter: Payer: Self-pay | Admitting: Internal Medicine

## 2015-04-09 NOTE — Telephone Encounter (Signed)
Appt request sent to front desk.Leslie White, Leslie Berry Cassady3/14/201710:02 AM

## 2015-05-01 ENCOUNTER — Ambulatory Visit: Payer: BLUE CROSS/BLUE SHIELD | Admitting: Internal Medicine

## 2015-05-06 ENCOUNTER — Other Ambulatory Visit: Payer: Self-pay | Admitting: Internal Medicine

## 2015-05-06 NOTE — Telephone Encounter (Signed)
Pt has not been seen in clinic since November She needs an appt Giving 1 month supply  Thanks.Marland Kitchen..:)

## 2015-05-17 ENCOUNTER — Telehealth: Payer: Self-pay | Admitting: Internal Medicine

## 2015-05-17 NOTE — Telephone Encounter (Signed)
APPT. REMINDER CALL, LMTCB °

## 2015-05-20 ENCOUNTER — Encounter: Payer: Self-pay | Admitting: Internal Medicine

## 2015-05-20 ENCOUNTER — Ambulatory Visit (INDEPENDENT_AMBULATORY_CARE_PROVIDER_SITE_OTHER): Payer: BLUE CROSS/BLUE SHIELD | Admitting: Internal Medicine

## 2015-05-20 VITALS — BP 156/67 | HR 64 | Temp 98.3°F | Ht 61.0 in | Wt 198.1 lb

## 2015-05-20 DIAGNOSIS — Z7984 Long term (current) use of oral hypoglycemic drugs: Secondary | ICD-10-CM | POA: Diagnosis not present

## 2015-05-20 DIAGNOSIS — Z0000 Encounter for general adult medical examination without abnormal findings: Secondary | ICD-10-CM

## 2015-05-20 DIAGNOSIS — I10 Essential (primary) hypertension: Secondary | ICD-10-CM | POA: Diagnosis not present

## 2015-05-20 DIAGNOSIS — E119 Type 2 diabetes mellitus without complications: Secondary | ICD-10-CM

## 2015-05-20 DIAGNOSIS — Z79899 Other long term (current) drug therapy: Secondary | ICD-10-CM | POA: Diagnosis not present

## 2015-05-20 DIAGNOSIS — E1165 Type 2 diabetes mellitus with hyperglycemia: Secondary | ICD-10-CM

## 2015-05-20 LAB — GLUCOSE, CAPILLARY
GLUCOSE-CAPILLARY: 190 mg/dL — AB (ref 65–99)
Glucose-Capillary: 202 mg/dL — ABNORMAL HIGH (ref 65–99)

## 2015-05-20 LAB — POCT GLYCOSYLATED HEMOGLOBIN (HGB A1C): HEMOGLOBIN A1C: 9.4

## 2015-05-20 MED ORDER — AMLODIPINE BESYLATE 10 MG PO TABS: 10.0000 mg | ORAL_TABLET | Freq: Every day | ORAL | Status: DC

## 2015-05-20 MED ORDER — LISINOPRIL 40 MG PO TABS: 40.0000 mg | ORAL_TABLET | Freq: Every day | ORAL | Status: DC

## 2015-05-20 MED ORDER — HYDROCHLOROTHIAZIDE 25 MG PO TABS: 25.0000 mg | ORAL_TABLET | Freq: Every day | ORAL | Status: DC

## 2015-05-20 MED ORDER — SITAGLIPTIN PHOSPHATE 100 MG PO TABS: 100.0000 mg | ORAL_TABLET | Freq: Every day | ORAL | Status: DC

## 2015-05-20 NOTE — Assessment & Plan Note (Signed)
Lab Results  Component Value Date   HGBA1C 9.4 05/20/2015   HGBA1C 7.8 12/05/2014   HGBA1C 7.5 08/22/2014     Assessment: Diabetes control:  not controlled Progress toward A1C goal:   not at goal Comments: only on metformin 1068m. She has met w/ DButch Pennyin the past. She did not bring her glucometer in w/ her. Has family hx of DM, has not tried diet or exercise. She is resistant to new medications stating she hasn't tried lifestyle modification yet but will soon.   Plan: Medications:  continue current medications, started on januvia 1034mdaily Home glucose monitoring: Frequency:  BID Timing:  am and pm Instruction/counseling given: reminded to get eye exam, reminded to bring blood glucose meter & log to each visit, discussed foot care, discussed the need for weight loss and discussed diet Educational resources provided: brochure (denied ) Self management tools provided:   Other plans: f/u in 3 months. Foot exam done today.

## 2015-05-20 NOTE — Assessment & Plan Note (Signed)
BP Readings from Last 3 Encounters:  05/20/15 156/67  12/05/14 160/64  08/22/14 156/69    Lab Results  Component Value Date   NA 141 12/05/2014   K 4.4 12/05/2014   CREATININE 0.77 12/05/2014    Assessment: Blood pressure control:  uncontrolled Progress toward BP goal:   not at goal Comments: on lisinopril 30mg , norvasc 10, and HCTZ 25mg . She has not worked on exercise and weight loss but states she will as she has a [employer] membership. Has family hx of HTN. PCP noted that HCTZ can be increased if BP still elevated. She has not been checking BP at home even though she has a BP machine.   Plan: Medications:  Continue norvasc and HCTZ. Increased lisinopril to 40mg .  Educational resources provided: brochure (denied) Self management tools provided:   Other plans: f/u in 3 months.

## 2015-05-20 NOTE — Progress Notes (Signed)
Subjective:   Patient ID: Leslie White female   DOB: 05/30/1955 60 y.o.   MRN: [id number]  HPI: [name] is a 60 y.o. with past medical history as outlined below who presents to clinic for DM follow up.   Please see problem list for status of the pt's chronic medical problems.  Past Medical History  Diagnosis Date  . Hypertension   . Diabetes mellitus without complication [facility](HCC)    Current Outpatient Prescriptions  Medication Sig Dispense Refill  . amLODipine (NORVASC) 10 MG tablet TAKE ONE TABLET BY MOUTH DAILY 30 tablet 0  . Ergocalciferol (VITAMIN D2 PO) Take 1 capsule by mouth See admin instructions. 1.25mg . Take one capsule by mouth every two weeks. No specific day she takes it on.    . fluticasone (FLONASE) 50 MCG/ACT nasal spray Place 2 sprays into both nostrils daily. 16 g 2  . glucose blood test strip Use as instructed 100 each 12  . hydrochlorothiazide (HYDRODIURIL) 25 MG tablet TAKE ONE TABLET BY MOUTH DAILY 30 tablet 0  . lisinopril (PRINIVIL,ZESTRIL) 30 MG tablet Take 1 tablet (30 mg total) by mouth daily. 90 tablet 4  . metFORMIN (GLUCOPHAGE) 1000 MG tablet Take 1 tablet (1,000 mg total) by mouth 2 (two) times daily with a meal. 60 tablet 9  . metFORMIN (GLUCOPHAGE) 1000 MG tablet Take 1 tablet (1,000 mg total) by mouth 2 (two) times daily with a meal. 60 tablet 11  . metoprolol (LOPRESSOR) 100 MG tablet Take 1 tablet (100 mg total) by mouth 2 (two) times daily. 60 tablet 10  . nitroGLYCERIN (NITROSTAT) 0.4 MG SL tablet Place 1 tablet (0.4 mg total) under the tongue every 5 (five) minutes as needed for chest pain. (Patient not taking: Reported on 12/05/2014) 30 tablet 0  . ONETOUCH DELICA LANCETS 33G MISC Use as directed 100 each 2  . pravastatin (PRAVACHOL) 80 MG tablet Take 1 tablet (80 mg total) by mouth every evening. (Patient not taking: Reported on 12/05/2014) 30 tablet 11   No current facility-administered medications for this visit.     No family history on file. Social History   Social History  . Marital Status: Married    Spouse Name: N/A  . Number of Children: N/A  . Years of Education: N/A   Social History Main Topics  . Smoking status: Former Smoker    Quit date: 09/08/1987  . Smokeless tobacco: None  . Alcohol Use: No  . Drug Use: No  . Sexual Activity: Not Asked   Other Topics Concern  . None   Social History Narrative   Review of Systems: Review of Systems  Constitutional: Negative for fever and chills.  Eyes: Negative for blurred vision.  Respiratory: Negative for sputum production.   Cardiovascular: Negative for chest pain.  Gastrointestinal: Negative for nausea, vomiting and abdominal pain.  Genitourinary: Negative for frequency.  Endo/Heme/Allergies: Negative for polydipsia.    Objective:  Physical Exam: Filed Vitals:   05/20/15 0825  BP: 156/67  Pulse: 64  Temp: 98.3 F (36.8 C)  TempSrc: Oral  Height: 5\' 1"  (1.549 m)  Weight: 198 lb 1.6 oz (89.858 kg)  SpO2: 100%   Physical Exam  Constitutional: She appears well-developed and well-nourished. No distress.  HENT:  Head: Normocephalic and atraumatic.  Nose: Nose normal.  Eyes: Conjunctivae and EOM are normal. No scleral icterus.  Cardiovascular: Normal rate, regular rhythm and normal heart sounds.  Exam reveals no gallop and no friction rub.   No murmur heard. Pulmonary/Chest:  Effort normal and breath sounds normal. No respiratory distress. She has no wheezes. She has no rales.  Abdominal: Soft. Bowel sounds are normal. She exhibits no distension. There is no tenderness. There is no rebound and no guarding.  Skin: Skin is warm and dry. No rash noted. She is not diaphoretic. No erythema. No pallor.    Assessment & Plan:   Please see problem based assessment and plan.

## 2015-05-20 NOTE — Patient Instructions (Signed)
Start taking lisinopril 40mg  daily for your blood pressure.   Start taking [place]uvia 100mg  daily for your diabetes.   Sitagliptin oral tablet What is this medicine? SITAGLIPTIN (sit a GLIP tin) helps to treat type 2 diabetes. It helps to control blood sugar. Treatment is combined with diet and exercise. This medicine may be used for other purposes; ask your health care provider or pharmacist if you have questions. What should I tell my health care provider before I take this medicine? They need to know if you have any of these conditions: -diabetic ketoacidosis -kidney disease -pancreatitis -previous swelling of the tongue, face, or lips with difficulty breathing, difficulty swallowing, hoarseness, or tightening of the throat -type 1 diabetes -an unusual or allergic reaction to sitagliptin, other medicines, foods, dyes, or preservatives -pregnant or trying to get pregnant -breast-feeding How should I use this medicine? Take this medicine by mouth with a glass of water. Follow the directions on the prescription label. You can take it with or without food. Do not cut, crush or chew this medicine. Take your dose at the same time each day. Do not take more often than directed. Do not stop taking except on your doctor's advice. Talk to your pediatrician regarding the use of this medicine in children. Special care may be needed. Overdosage: If you think you have taken too much of this medicine contact a poison control center or emergency room at once. NOTE: This medicine is only for you. Do not share this medicine with others. What if I miss a dose? If you miss a dose, take it as soon as you can. If it is almost time for your next dose, take only that dose. Do not take double or extra doses. What may interact with this medicine? Do not take this medicine with any of the following medications: -gatifloxacin This medicine may also interact with the following  medications: -alcohol -digoxin -insulin -sulfonylureas like glimepiride, glipizide, glyburide This list may not describe all possible interactions. Give your health care provider a list of all the medicines, herbs, non-prescription drugs, or dietary supplements you use. Also tell them if you smoke, drink alcohol, or use illegal drugs. Some items may interact with your medicine. What should I watch for while using this medicine? Visit your doctor or health care professional for regular checks on your progress. A test called the HbA1C (A1C) will be monitored. This is a simple blood test. It measures your blood sugar control over the last 2 to 3 months. You will receive this test every 3 to 6 months. Learn how to check your blood sugar. Learn the symptoms of low and high blood sugar and how to manage them. Always carry a quick-source of sugar with you in case you have symptoms of low blood sugar. Examples include hard sugar candy or glucose tablets. Make sure others know that you can choke if you eat or drink when you develop serious symptoms of low blood sugar, such as seizures or unconsciousness. They must get medical help at once. Tell your doctor or health care professional if you have high blood sugar. You might need to change the dose of your medicine. If you are sick or exercising more than usual, you might need to change the dose of your medicine. Do not skip meals. Ask your doctor or health care professional if you should avoid alcohol. Many nonprescription cough and cold products contain sugar or alcohol. These can affect blood sugar. Wear a medical ID bracelet or chain, and  carry a card that describes your disease and details of your medicine and dosage times. What side effects may I notice from receiving this medicine? Side effects that you should report to your doctor or health care professional as soon as possible: -allergic reactions like skin rash, itching or hives, swelling of the face,  lips, or tongue -breathing problems -fever, chills -joint pain -loss of appetite -signs and symptoms of low blood sugar such as feeling anxious, confusion, dizziness, increased hunger, unusually weak or tired, sweating, shakiness, cold, irritable, headache, blurred vision, fast heartbeat, loss of consciousness -unusual stomach pain or discomfort -vomiting Side effects that usually do not require medical attention (report to your doctor or health care professional if they continue or are bothersome): -diarrhea -headache -sore throat -stomach upset -stuffy or runny nose This list may not describe all possible side effects. Call your doctor for medical advice about side effects. You may report side effects to FDA at 1-800-FDA-1088. Where should I keep my medicine? Keep out of the reach of children. Store at room temperature between 15 and 30 degrees C (59 and 86 degrees F). Throw away any unused medicine after the expiration date. NOTE: This sheet is a summary. It may not cover all possible information. If you have questions about this medicine, talk to your doctor, pharmacist, or health care provider.    2016, [employer]vier/[employer] Standard. (2013-09-22 17:46:21)

## 2015-05-20 NOTE — Addendum Note (Signed)
Addended by: Doneen PoissonKLIMA, Joeline Freer D on: 05/20/2015 10:31 AM   Modules accepted: Level of Service

## 2015-05-20 NOTE — Assessment & Plan Note (Signed)
Given stool cards today.  

## 2015-05-20 NOTE — Progress Notes (Signed)
Case discussed with Dr. Truong at the time of the visit.  We reviewed the resident's history and exam and pertinent patient test results.  I agree with the assessment, diagnosis, and plan of care documented in the resident's note. 

## 2015-08-14 ENCOUNTER — Ambulatory Visit: Payer: BLUE CROSS/BLUE SHIELD

## 2015-09-02 ENCOUNTER — Other Ambulatory Visit: Payer: Self-pay | Admitting: Internal Medicine

## 2015-10-24 ENCOUNTER — Other Ambulatory Visit: Payer: Self-pay | Admitting: Internal Medicine

## 2015-10-24 DIAGNOSIS — I10 Essential (primary) hypertension: Secondary | ICD-10-CM

## 2015-12-13 ENCOUNTER — Other Ambulatory Visit: Payer: Self-pay | Admitting: Internal Medicine

## 2015-12-30 ENCOUNTER — Telehealth: Payer: Self-pay | Admitting: Internal Medicine

## 2015-12-30 NOTE — Telephone Encounter (Signed)
Given 2 month supply of metoprolol Pt needs appt

## 2016-01-07 ENCOUNTER — Encounter: Payer: Self-pay | Admitting: Internal Medicine

## 2016-01-07 NOTE — Telephone Encounter (Signed)
Attempted to contact patient this am.  No answer lmtcb and going to mail letter also.

## 2016-02-17 ENCOUNTER — Other Ambulatory Visit: Payer: Self-pay | Admitting: Internal Medicine

## 2016-02-17 DIAGNOSIS — I10 Essential (primary) hypertension: Secondary | ICD-10-CM

## 2016-02-17 NOTE — Telephone Encounter (Signed)
Called home phone but no answer, left msg to call back. Pt need an appt.

## 2016-02-17 NOTE — Telephone Encounter (Signed)
Pt really needs appt- she has not been seen since April 2017 Giving 30 day lisinopril no refills

## 2016-03-06 ENCOUNTER — Other Ambulatory Visit: Payer: Self-pay

## 2016-03-06 DIAGNOSIS — I10 Essential (primary) hypertension: Secondary | ICD-10-CM

## 2016-03-09 ENCOUNTER — Encounter: Payer: BLUE CROSS/BLUE SHIELD | Admitting: Internal Medicine

## 2016-03-09 ENCOUNTER — Encounter: Payer: Self-pay | Admitting: Internal Medicine

## 2016-03-09 MED ORDER — AMLODIPINE BESYLATE 10 MG PO TABS: 10.0000 mg | ORAL_TABLET | Freq: Every day | ORAL | 0 refills | Status: DC

## 2016-03-09 MED ORDER — HYDROCHLOROTHIAZIDE 25 MG PO TABS: 25.0000 mg | ORAL_TABLET | Freq: Every day | ORAL | 0 refills | Status: DC

## 2016-03-09 MED ORDER — METOPROLOL TARTRATE 100 MG PO TABS: 100.0000 mg | ORAL_TABLET | Freq: Two times a day (BID) | ORAL | 0 refills | Status: DC

## 2016-03-09 MED ORDER — LISINOPRIL 40 MG PO TABS: 40.0000 mg | ORAL_TABLET | Freq: Every day | ORAL | 0 refills | Status: DC

## 2016-03-09 NOTE — Telephone Encounter (Signed)
Pt needs an appt.  I have refilled for 1 month and can not refill further as she has not been seen since April 2017

## 2016-03-16 ENCOUNTER — Other Ambulatory Visit: Payer: Self-pay | Admitting: Internal Medicine

## 2016-04-14 ENCOUNTER — Other Ambulatory Visit: Payer: Self-pay | Admitting: Internal Medicine

## 2016-04-14 DIAGNOSIS — I10 Essential (primary) hypertension: Secondary | ICD-10-CM

## 2016-04-14 NOTE — Telephone Encounter (Signed)
Attempted to contact patient regarding rx refill.  [facility] cant not  provide any further refills until pt is seen in the office.  Message was left on pt's recorder that she will need to be seen.  Message also stated that if she is being followed by another MD in [place], she should forward request to that office.  Phone call complete.Kingsley SpittleGoldston, Festus Pursel Cassady3/20/20183:54 PM

## 2016-04-17 ENCOUNTER — Other Ambulatory Visit: Payer: Self-pay | Admitting: Internal Medicine

## 2016-04-17 DIAGNOSIS — I10 Essential (primary) hypertension: Secondary | ICD-10-CM

## 2016-04-20 ENCOUNTER — Other Ambulatory Visit: Payer: Self-pay | Admitting: Internal Medicine

## 2016-04-20 DIAGNOSIS — I10 Essential (primary) hypertension: Secondary | ICD-10-CM

## 2016-04-20 MED ORDER — LISINOPRIL 40 MG PO TABS: 40.0000 mg | ORAL_TABLET | Freq: Every day | ORAL | 0 refills | Status: DC

## 2016-04-20 NOTE — Telephone Encounter (Signed)
Patient asking about refill. lisinopril (PRINIVIL,ZESTRIL) 40 MG tablet

## 2016-04-20 NOTE — Telephone Encounter (Signed)
Patient has not been seen in a year now. She has also had 2 letters sent to her.  I will refill for 30 days- can not refill further until she makes an appointment.   Please let her know  thanks

## 2016-05-01 ENCOUNTER — Ambulatory Visit: Payer: BLUE CROSS/BLUE SHIELD

## 2016-05-06 ENCOUNTER — Encounter (INDEPENDENT_AMBULATORY_CARE_PROVIDER_SITE_OTHER): Payer: Self-pay

## 2016-05-06 ENCOUNTER — Ambulatory Visit (INDEPENDENT_AMBULATORY_CARE_PROVIDER_SITE_OTHER): Payer: BLUE CROSS/BLUE SHIELD | Admitting: Internal Medicine

## 2016-05-06 ENCOUNTER — Encounter: Payer: Self-pay | Admitting: Internal Medicine

## 2016-05-06 VITALS — BP 226/87 | HR 73 | Temp 98.7°F | Ht 61.0 in | Wt 193.7 lb

## 2016-05-06 DIAGNOSIS — I10 Essential (primary) hypertension: Secondary | ICD-10-CM | POA: Diagnosis not present

## 2016-05-06 DIAGNOSIS — E119 Type 2 diabetes mellitus without complications: Secondary | ICD-10-CM

## 2016-05-06 DIAGNOSIS — Z9114 Patient's other noncompliance with medication regimen: Secondary | ICD-10-CM | POA: Diagnosis not present

## 2016-05-06 DIAGNOSIS — E118 Type 2 diabetes mellitus with unspecified complications: Secondary | ICD-10-CM

## 2016-05-06 DIAGNOSIS — I110 Hypertensive heart disease with heart failure: Secondary | ICD-10-CM | POA: Diagnosis not present

## 2016-05-06 DIAGNOSIS — Z79899 Other long term (current) drug therapy: Secondary | ICD-10-CM | POA: Diagnosis not present

## 2016-05-06 DIAGNOSIS — Z87891 Personal history of nicotine dependence: Secondary | ICD-10-CM | POA: Diagnosis not present

## 2016-05-06 DIAGNOSIS — I160 Hypertensive urgency: Secondary | ICD-10-CM | POA: Diagnosis not present

## 2016-05-06 LAB — POCT GLYCOSYLATED HEMOGLOBIN (HGB A1C): HEMOGLOBIN A1C: 10

## 2016-05-06 LAB — GLUCOSE, CAPILLARY: GLUCOSE-CAPILLARY: 235 mg/dL — AB (ref 65–99)

## 2016-05-06 MED ORDER — CLONIDINE HCL 0.1 MG/24HR TD PTWK: 0.1000 mg | MEDICATED_PATCH | TRANSDERMAL | 0 refills | Status: DC

## 2016-05-06 NOTE — Progress Notes (Signed)
   CC: Overdue follow up for hypertension  HPI:  Leslie White is a 61 y.o. woman here today for medication refills and blood pressure management   See problem based assessment and plan below for additional details  Past Medical History:  Diagnosis Date  . Diabetes mellitus without complication (HCC)   . Hypertension     Review of Systems:  Review of Systems  HENT: Negative for tinnitus.   Eyes: Negative for blurred vision and double vision.  Respiratory: Negative for shortness of breath.   Cardiovascular: Negative for chest pain and leg swelling.  Gastrointestinal: Negative for abdominal pain.  Musculoskeletal: Positive for joint pain.  Neurological: Negative for dizziness, sensory change and headaches.    Physical Exam: Physical Exam  Constitutional: She is well-developed, well-nourished, and in no distress.  Eyes: Conjunctivae are normal.  Cardiovascular: Normal rate and regular rhythm.   Pulmonary/Chest: Effort normal and breath sounds normal.  Musculoskeletal: Normal range of motion. She exhibits no edema.  Neurological: She is alert. She has normal reflexes. Gait normal.  Skin: Skin is warm and dry. No rash noted.  Psychiatric: Affect normal.    Vitals:   05/06/16 1016  BP: (!) 226/87  Pulse: 73  Temp: 98.7 F (37.1 C)  TempSrc: Oral  SpO2: 98%  Weight: 193 lb 11.2 oz (87.9 kg)  Height:  (1.549 m)     Assessment & Plan:   See Encounters Tab for problem based charting.  Patient discussed with with Dr. Heide Spark

## 2016-05-06 NOTE — Patient Instructions (Signed)
It was a pleasure to see you today [name] I am worried about your extremely high blood pressure. It is high enough that you are at an increased risk for stroke, heart attack, or other major event that could be debilitating or life threatening.  I recognize that you do not wish to miss work or spend a day in the hospital for this problem even though that would be the absolute safest course of action. It is ESSENTIAL that you pick up your medication and start taking it and do not miss any doses at this time.  We will plan to see you on Monday or sooner if you start to feel badly. If you notice more headaches, vision changes, or chest pain do not wait and please call us or come to the ED for evaluation.

## 2016-05-07 LAB — BMP8+ANION GAP
Anion Gap: 15 mmol/L (ref 10.0–18.0)
BUN / CREAT RATIO: 30 — AB (ref 12–28)
BUN: 25 mg/dL (ref 8–27)
CHLORIDE: 96 mmol/L (ref 96–106)
CO2: 26 mmol/L (ref 18–29)
Calcium: 10.2 mg/dL (ref 8.7–10.3)
Creatinine, Ser: 0.83 mg/dL (ref 0.57–1.00)
GFR calc non Af Amer: 76 mL/min/{1.73_m2} (ref >59)
GFR, EST AFRICAN AMERICAN: 88 mL/min/{1.73_m2} (ref >59)
GLUCOSE: 208 mg/dL — AB (ref 65–99)
POTASSIUM: 4.6 mmol/L (ref 3.5–5.2)
SODIUM: 137 mmol/L (ref 134–144)

## 2016-05-07 LAB — CBC
Hematocrit: 38.7 % (ref 34.0–46.6)
Hemoglobin: 12.9 g/dL (ref 11.1–15.9)
MCH: 28.7 pg (ref 26.6–33.0)
MCHC: 33.3 g/dL (ref 31.5–35.7)
MCV: 86 fL (ref 79–97)
PLATELETS: 268 10*3/uL (ref 150–379)
RBC: 4.49 x10E6/uL (ref 3.77–5.28)
RDW: 13.3 % (ref 12.3–15.4)
WBC: 5 10*3/uL (ref 3.4–10.8)

## 2016-05-08 NOTE — Assessment & Plan Note (Signed)
Hgb a1c is 10.0% today. She has not actually started the Januvia due to cost.  Alternative oral agents may be more affordable such as glipizide. No new adjustments were made today however due to time spent counseling regarding her hypertensive urgency.

## 2016-05-08 NOTE — Assessment & Plan Note (Signed)
HPI: She has not had this problem addressed since one year ago when she was moderately uncontrolled. She is not checking her blood pressure at home but does have a machine. She took herself off amlodipine  daily because she felt it was causing headaches that resolved after stopping the medication. She continues to take lisinopril  and HCTZ  regularly. She has not made any real progress with diet or exercise changes and states she is busy with work that keeps her on her feet. Blood pressure checked in clinic today at 226/87.  A: This is hypertensive urgency due to severely elevated blood pressures with no symptoms or signs of end organ dysfunction. I will start clonidine as an additional medicine at this time. Due to her history of stopping some medicines and being too busy to take PM doses consistently I will recommend a transdermal patch. We will check basic labs to confirm no evidence of renal function impairment.  Leslie White is very concerned about missing work due to finances and was resistant to recommendations of close follow up. She absolutely did not want to be seen at the hospital for blood pressure correction. After discussion about the risk of a major CV event or organ damage from her severely elevated blood pressure she was agreeable to start clonidine within the next day and follow up Monday 4/16 PM for recheck.  P: Continue lisinopril , HCTZ  Start clonidine TD 0.1mg /day BMP checked today - normal RTC on 4/16

## 2016-05-11 ENCOUNTER — Ambulatory Visit: Payer: BLUE CROSS/BLUE SHIELD

## 2016-05-12 ENCOUNTER — Other Ambulatory Visit: Payer: Self-pay | Admitting: Internal Medicine

## 2016-05-12 NOTE — Progress Notes (Signed)
Internal Medicine Clinic Attending  Case discussed with Dr. Rice at the time of the visit.  We reviewed the resident's history and exam and pertinent patient test results.  I agree with the assessment, diagnosis, and plan of care documented in the resident's note.  

## 2016-05-13 NOTE — Telephone Encounter (Signed)
Metoprolol stopped at most recent visit

## 2016-05-14 ENCOUNTER — Ambulatory Visit (INDEPENDENT_AMBULATORY_CARE_PROVIDER_SITE_OTHER): Payer: BLUE CROSS/BLUE SHIELD | Admitting: Internal Medicine

## 2016-05-14 VITALS — BP 205/81 | HR 65 | Temp 97.5°F | Wt 193.6 lb

## 2016-05-14 DIAGNOSIS — E669 Obesity, unspecified: Secondary | ICD-10-CM | POA: Diagnosis not present

## 2016-05-14 DIAGNOSIS — Z79899 Other long term (current) drug therapy: Secondary | ICD-10-CM

## 2016-05-14 DIAGNOSIS — I10 Essential (primary) hypertension: Secondary | ICD-10-CM | POA: Diagnosis not present

## 2016-05-14 DIAGNOSIS — Z6836 Body mass index (BMI) 36.0-36.9, adult: Secondary | ICD-10-CM | POA: Diagnosis not present

## 2016-05-14 DIAGNOSIS — R809 Proteinuria, unspecified: Secondary | ICD-10-CM

## 2016-05-14 DIAGNOSIS — Z87891 Personal history of nicotine dependence: Secondary | ICD-10-CM | POA: Diagnosis not present

## 2016-05-14 DIAGNOSIS — E119 Type 2 diabetes mellitus without complications: Secondary | ICD-10-CM

## 2016-05-14 DIAGNOSIS — E1129 Type 2 diabetes mellitus with other diabetic kidney complication: Secondary | ICD-10-CM | POA: Diagnosis not present

## 2016-05-14 DIAGNOSIS — Z7984 Long term (current) use of oral hypoglycemic drugs: Secondary | ICD-10-CM

## 2016-05-14 MED ORDER — NIFEDIPINE ER OSMOTIC RELEASE 90 MG PO TB24: 90.0000 mg | ORAL_TABLET | Freq: Every day | ORAL | 0 refills | Status: DC

## 2016-05-14 MED ORDER — LISINOPRIL 40 MG PO TABS: 40.0000 mg | ORAL_TABLET | Freq: Every day | ORAL | 2 refills | Status: DC

## 2016-05-14 MED ORDER — CLONIDINE HCL 0.2 MG/24HR TD PTWK: 0.2000 mg | MEDICATED_PATCH | TRANSDERMAL | 0 refills | Status: DC

## 2016-05-14 NOTE — Patient Instructions (Signed)
Leslie White,  It was a pleasure meeting you today.   When you change out your clonidine patch on Sunday, please replace it with two of the 0.1 mg patches on two different body areas. I am also sending in a prescription for a new blood pressure medication called nifedipine.   We will check some blood work again today.  Please follow up in clinic in about 1 week.

## 2016-05-14 NOTE — Progress Notes (Signed)
   CC: HTN follow up  HPI:  [name] is a 61 y.o. female with a past medical history listed below here today for follow up of her HTN and DM.  For details of today's visit and the status of her chronic medical issues please refer to the assessment and plan.    Past Medical History:  Diagnosis Date  . Diabetes mellitus without complication (HCC)   . Hypertension     Review of Systems:   See HPI  Physical Exam:  Vitals:   05/14/16 1530 05/14/16 1556  BP: (!) 240/87 (!) 205/81  Pulse: 71 65  Temp: 97.5 F (36.4 C)   TempSrc: Oral   SpO2: 99% 98%  Weight: 193 lb 9.6 oz (87.8 kg)    Physical Exam  Constitutional: She is well-developed, well-nourished, and in no distress.  Obese female  Cardiovascular: Normal rate and regular rhythm.   Pulmonary/Chest: Effort normal and breath sounds normal.  Musculoskeletal: She exhibits no edema.  Vitals reviewed.    Assessment & Plan:   See Encounters Tab for problem based charting.  Patient discussed with Dr. Josem Kaufmann

## 2016-05-15 LAB — MICROALBUMIN / CREATININE URINE RATIO
CREATININE, UR: 34.1 mg/dL
MICROALB/CREAT RATIO: 197.4 mg/g{creat} — AB (ref 0.0–30.0)
MICROALBUM., U, RANDOM: 67.3 ug/mL

## 2016-05-15 NOTE — Assessment & Plan Note (Addendum)
BP Readings from Last 3 Encounters:  05/14/16 (!) 205/81  05/06/16 (!) 226/87  05/20/15 (!) 156/67    Lab Results  Component Value Date   NA 137 05/06/2016   K 4.6 05/06/2016   CREATININE 0.83 05/06/2016   BP today was 240/87 initially and improved to 205/81 on re-check. She was seen in clinic on 05/06/16 and BP was grossly elevated at 226/82 at that time. She was taking lisinopril 40 mg daily and HCTZ 25 mg daily and reported compliance with those medications. She reported self discontinuation of amlodipine because she did not like how it made her feel. She was started on clonidine patches 0.1 mg qweekly at last visit.   Denies any symptoms today; no headaches, vision changes, dizziness/lightheadedness, chest pain, nausea/vomiting, palpitations.   Assessment: Severe uncontrolled HTN  Plan: Had lengthy discussion today regarding her blood pressure. She is very reluctant to add any addition medications. Discussed her high risk for major CV event, end organ damage (specifically ESRD as she mentioned a fear of that), CVA etc. Reluctantly agreeable to additional medications.   Increased clonidine to 0.2 mg qweekly starting at next patch change. Will also add nifedipine 90 mg daily ($4 list at [facility]). Agreed to follow up in 1 week.   Additionally, will investigate secondary causes of hypertension. Has never had any evidence of hypokalemia in the past but will measure renin:aldo today to rule out hypoaldosteronism. If negative, will consider pursuing renal US to look for RAS.

## 2016-05-15 NOTE — Assessment & Plan Note (Signed)
Lab Results  Component Value Date   HGBA1C 10.0 05/06/2016   HGBA1C 9.4 05/20/2015   HGBA1C 7.8 12/05/2014    A1c 05/06/16 was 10.0 and has been trending up for some time now. Currently only taking metformin 1000 mg bid. Reports she was unable to afford the Januvia ($50/month). She is very resistant to starting any new medications. Had long discussion today regarding her DM and the importance of better control to prevent adverse side effects. Given her severe HTN that was our focus today and made changes to her regimen there and she was not amenable to changing her DM medications at this time.   Had long discussion on her diet and exercise. Reported sweets as a big weakness for her. Gets no regular exercise.   Assessment: Poorly controlled DM Type II  Plan: Will continue current medications today; would like to add additional agents in the future when patient is more agreeable. Encouraged dietary changes and exercise.   Microalbuminuria has significantly worsened in the past year; 26.9 > 197.4. Currently on max dose ACE.

## 2016-05-16 ENCOUNTER — Other Ambulatory Visit: Payer: Self-pay | Admitting: Internal Medicine

## 2016-05-18 LAB — ALDOSTERONE + RENIN ACTIVITY W/ RATIO
ALDOSTERONE: 4.4 ng/dL (ref 0.0–30.0)
Renin: <0.167 ng/mL/hr — ABNORMAL LOW (ref 0.167–5.380)

## 2016-05-18 NOTE — Progress Notes (Signed)
Case discussed with Dr. Boswell at the time of the visit.  We reviewed the resident's history and exam and pertinent patient test results.  I agree with the assessment, diagnosis and plan of care documented in the resident's note. 

## 2016-05-18 NOTE — Telephone Encounter (Signed)
Received refill request from pt's pharmacy for metoprolol  tabs-take one tab twice daily.  Will send refill request to pcp for review.Leslie Spittle Cassady4/23/201810:38 AM

## 2016-05-18 NOTE — Telephone Encounter (Signed)
Per most recent notes, she is not supposed to be on metoprolol.

## 2016-05-20 ENCOUNTER — Ambulatory Visit: Payer: BLUE CROSS/BLUE SHIELD

## 2016-05-20 ENCOUNTER — Telehealth: Payer: Self-pay

## 2016-05-20 NOTE — Telephone Encounter (Signed)
Pt would like a clear list of her meds, she is not sure if she is to continue hctz and metoprolol, please advise

## 2016-05-20 NOTE — Telephone Encounter (Signed)
Question about meds. Please call pt back.  

## 2016-05-20 NOTE — Telephone Encounter (Signed)
Yes she is to continue the HCTZ and Metoprolol. We increased the clonidine to 0.2 mg patches weekly and added the Nifedipine 90 mg daliy.

## 2016-05-27 ENCOUNTER — Ambulatory Visit (INDEPENDENT_AMBULATORY_CARE_PROVIDER_SITE_OTHER): Payer: BLUE CROSS/BLUE SHIELD | Admitting: Internal Medicine

## 2016-05-27 ENCOUNTER — Encounter: Payer: Self-pay | Admitting: Internal Medicine

## 2016-05-27 VITALS — BP 201/72 | HR 65 | Temp 98.0°F | Ht 61.0 in | Wt 191.7 lb

## 2016-05-27 DIAGNOSIS — I10 Essential (primary) hypertension: Secondary | ICD-10-CM

## 2016-05-27 DIAGNOSIS — Z87891 Personal history of nicotine dependence: Secondary | ICD-10-CM

## 2016-05-27 DIAGNOSIS — Z79899 Other long term (current) drug therapy: Secondary | ICD-10-CM | POA: Diagnosis not present

## 2016-05-27 MED ORDER — HYDRALAZINE HCL 25 MG PO TABS: 25.0000 mg | ORAL_TABLET | Freq: Three times a day (TID) | ORAL | 1 refills | Status: DC

## 2016-05-27 MED ORDER — METOPROLOL TARTRATE 100 MG PO TABS: 100.0000 mg | ORAL_TABLET | Freq: Two times a day (BID) | ORAL | 1 refills | Status: DC

## 2016-05-27 NOTE — Progress Notes (Signed)
Internal Medicine Clinic Attending  Case discussed with Dr. Ahmed at the time of the visit.  We reviewed the resident's history and exam and pertinent patient test results.  I agree with the assessment, diagnosis, and plan of care documented in the resident's note. 

## 2016-05-27 NOTE — Assessment & Plan Note (Signed)
currently on lisinopril  daily + metop 100 BID + hctz  daily.  Going up on clonidine and starting Nifidipine made her heart race, sweating. Stopped taking both. Now doing ok. No complaints such as HA, change in vision, numbness, tingling, chest pain, sob.  -will start hydralazine  TID. Cont  lisinopril  daily + metop 100 BID + hctz  daily - Renal u/s to look for renal artery stenosis  - f/up in 2 weeks.

## 2016-05-27 NOTE — Patient Instructions (Signed)
lisinopril  daily + metop 100 twice daily + hctz  daily continued  Starting hydralazine 25 three times a day.  Follow up in 2 weeks.  Will get renal artery ultrasound.

## 2016-05-27 NOTE — Progress Notes (Signed)
   CC: HTN f/up  HPI:  [name] is a 61 y.o. with PMh as listed below is here for HTN f/up   Past Medical History:  Diagnosis Date  . Diabetes mellitus without complication (HCC)   . Hypertension    Was here on 4/19 for HTN management, BP that visit was 240/87 and 205/81 on repeat. She last visit reluctantly agreed to take clonidine 0.2mg  patch weekly and also added nifidipine  daily. Also on lisinopril  daily + metop 100 BID + hctz  daily.  Renal u/s to look for renal artery stenosis was discussed in the plan. Going up on clonidine and starting Nifidipine made her heart race, sweating. Stopped taking both. Now doing ok. No complaints such as HA, change in vision, numbness, tingling, chest pain, sob.   Review of Systems:   See above.   Physical Exam:  Vitals:   05/27/16 0844 05/27/16 0909  BP: (!) 222/84 (!) 201/72  Pulse: 69 65  Temp: 98 F (36.7 C)   TempSrc: Oral   SpO2: 100%   Weight: 191 lb 11.2 oz (87 kg)   Height:  (1.549 m)    Physical Exam  Constitutional: She is oriented to person, place, and time. She appears well-developed and well-nourished. No distress.  HENT:  Head: Normocephalic and atraumatic.  Cardiovascular: Normal rate and regular rhythm.  Exam reveals no gallop and no friction rub.   No murmur heard. Respiratory: Effort normal and breath sounds normal. No respiratory distress. She has no wheezes.  Musculoskeletal: Normal range of motion. She exhibits no edema.  Neurological: She is alert and oriented to person, place, and time.  Skin: She is not diaphoretic.     Assessment & Plan:   See Encounters Tab for problem based charting.  Patient discussed with Dr. Heide Spark

## 2016-06-09 ENCOUNTER — Other Ambulatory Visit: Payer: Self-pay | Admitting: Internal Medicine

## 2016-06-09 DIAGNOSIS — I10 Essential (primary) hypertension: Secondary | ICD-10-CM

## 2016-06-24 ENCOUNTER — Ambulatory Visit (INDEPENDENT_AMBULATORY_CARE_PROVIDER_SITE_OTHER): Payer: BLUE CROSS/BLUE SHIELD | Admitting: Internal Medicine

## 2016-06-24 VITALS — BP 178/74 | HR 67 | Temp 98.3°F | Ht 61.5 in | Wt 193.2 lb

## 2016-06-24 DIAGNOSIS — Z87891 Personal history of nicotine dependence: Secondary | ICD-10-CM | POA: Diagnosis not present

## 2016-06-24 DIAGNOSIS — Z79899 Other long term (current) drug therapy: Secondary | ICD-10-CM

## 2016-06-24 DIAGNOSIS — E663 Overweight: Secondary | ICD-10-CM | POA: Diagnosis not present

## 2016-06-24 DIAGNOSIS — I10 Essential (primary) hypertension: Secondary | ICD-10-CM

## 2016-06-24 MED ORDER — HYDRALAZINE HCL 50 MG PO TABS: 50.0000 mg | ORAL_TABLET | Freq: Three times a day (TID) | ORAL | 1 refills | Status: DC

## 2016-06-24 NOTE — Patient Instructions (Signed)
We will increase your hydralazine.  Try to start exercising and avoid too much salt in your diet.  Follow up in 6 weeks

## 2016-06-24 NOTE — Progress Notes (Signed)
   CC: HTN f/up   HPI:  [name] is a 61 y.o. with pmh as listed below is here for HTN f/up   Currently on lisinopril 40 mg daily + metop 100mg  bid + hctz 25 mg daily (did not tolerate clonidine and nifidipine last visit). We started hydral 25 mg TID last visit. Ordered renal artery u/s which has not been done yet. She is doing well, taking all of the meds without missing any dose. Not eating extra salt in her food. Has not started exercising yet but she stated she will start soon.     Past Medical History:  Diagnosis Date  . Diabetes mellitus without complication (HCC)   . Hypertension     Review of Systems:   Review of Systems  Constitutional: Negative for chills and fever.  Respiratory: Negative for cough.   Cardiovascular: Negative for chest pain and palpitations.  Neurological: Negative for dizziness and headaches.     Physical Exam:  Vitals:   06/24/16 0837 06/24/16 0853  BP: (!) 190/73 (!) 178/74  Pulse: 72 67  Temp: 98.3 F (36.8 C)   TempSrc: Oral   SpO2: 100%   Weight: 193 lb 3.2 oz (87.6 kg)   Height: 5' 1.5" (1.562 m)    Physical Exam  Constitutional: She is oriented to person, place, and time. She appears well-developed and well-nourished.  Overweight female   HENT:  Head: Normocephalic and atraumatic.  Respiratory: Effort normal and breath sounds normal. No respiratory distress. She has no wheezes.  Musculoskeletal: Normal range of motion. She exhibits no edema or tenderness.  Neurological: She is alert and oriented to person, place, and time.  Psychiatric: She has a normal mood and affect.    Assessment & Plan:   See Encounters Tab for problem based charting.  Patient discussed with Dr. Cleda DaubE. Hoffman

## 2016-06-24 NOTE — Assessment & Plan Note (Signed)
Vitals:   06/24/16 0837 06/24/16 0853  BP: (!) 190/73 (!) 178/74  Pulse: 72 67  Temp: 98.3 F (36.8 C)    BP is improving but not at goal yet. Currently on lisinopril 40 mg daily + metop 100mg  bid + hctz 25 mg daily + hydral 25mg  TID.   - increase hydral to 50mg  tid, cont other meds - will try to get the renal artery u/s done.

## 2016-06-25 ENCOUNTER — Encounter (HOSPITAL_COMMUNITY): Payer: BLUE CROSS/BLUE SHIELD

## 2016-06-29 NOTE — Progress Notes (Signed)
Internal Medicine Clinic Attending  Case discussed with Dr. Ahmed at the time of the visit.  We reviewed the resident's history and exam and pertinent patient test results.  I agree with the assessment, diagnosis, and plan of care documented in the resident's note. 

## 2016-07-01 ENCOUNTER — Ambulatory Visit (HOSPITAL_COMMUNITY)
Admission: RE | Admit: 2016-07-01 | Discharge: 2016-07-01 | Disposition: A | Discharge status: Home / Self Care | Payer: BLUE CROSS/BLUE SHIELD | Source: Ambulatory Visit | Attending: Internal Medicine | Admitting: Internal Medicine

## 2016-07-01 DIAGNOSIS — I10 Essential (primary) hypertension: Secondary | ICD-10-CM

## 2016-07-01 NOTE — Progress Notes (Signed)
**  Preliminary report by tech**  Renal artery duplex complete. No evidence of renal artery stenosis noted bilaterally.   07/01/16 3:47 PM Olen CordialGreg Ainhoa Rallo RVT

## 2016-07-07 ENCOUNTER — Other Ambulatory Visit: Payer: Self-pay | Admitting: Internal Medicine

## 2016-07-07 DIAGNOSIS — I10 Essential (primary) hypertension: Secondary | ICD-10-CM

## 2016-07-21 ENCOUNTER — Other Ambulatory Visit: Payer: Self-pay | Admitting: Internal Medicine

## 2016-07-21 DIAGNOSIS — I10 Essential (primary) hypertension: Secondary | ICD-10-CM

## 2016-07-21 NOTE — Telephone Encounter (Signed)
pls sch July 23rd appt PCP. If pi unable to come that day, July ACC appt ACC. HTN F/U

## 2016-08-19 ENCOUNTER — Encounter: Payer: Self-pay | Admitting: Internal Medicine

## 2016-08-19 ENCOUNTER — Ambulatory Visit (INDEPENDENT_AMBULATORY_CARE_PROVIDER_SITE_OTHER): Payer: BLUE CROSS/BLUE SHIELD | Admitting: Internal Medicine

## 2016-08-19 VITALS — BP 191/71 | HR 71 | Temp 98.2°F | Ht 61.0 in | Wt 192.4 lb

## 2016-08-19 DIAGNOSIS — R0683 Snoring: Secondary | ICD-10-CM

## 2016-08-19 DIAGNOSIS — Z87891 Personal history of nicotine dependence: Secondary | ICD-10-CM | POA: Diagnosis not present

## 2016-08-19 DIAGNOSIS — I10 Essential (primary) hypertension: Secondary | ICD-10-CM

## 2016-08-19 MED ORDER — SPIRONOLACTONE 25 MG PO TABS: 25.0000 mg | ORAL_TABLET | Freq: Every day | ORAL | 11 refills | Status: DC

## 2016-08-19 NOTE — Assessment & Plan Note (Signed)
The patient has uncontrolled hypertension consistently elevated to 200s or 190s systolic, today BP is 191/71. Medication noncompliance is been issues in the past, but patient reports taking all medications as prescribed recently. Her blood pressure is not significantly changed from her last visit on May 30 when hydralazine was increased to 50 mg 3 times a day. She continues to deny any symptoms concerning for hypertensive emergency. Renal artery ultrasound on June 6 was negative for renal artery stenosis, renin and aldosterone lab testing revealed borderline lowrenin and normal aldosterone. 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 Will add to her current regimen and assess response and check BMP for potassium at follow-up  -Start Spironolactone 25 mg daily  -Continue lisinopril 40 mg daily, metoprolol 100 mg twice a day, hydrochlorothiazide 25 mg daily, hydralazine 50 mg 3 times a day

## 2016-08-19 NOTE — Progress Notes (Signed)
   CC: Hypertension follow-up  HPI:  [name] is a [age] y.o. female with past medical history as described below who presents to the clinic for hypertension follow-up.  The patient has been frequently seen for management of her difficult to control blood pressure, last visit on May 30. Since that visit she reports adherence to prescribed medication regimen. Initially, missed occasional doses of hydralazine but reports taking all 3 doses consistently for the last several days. She continues to deny symptoms including headaches, chest pain, shortness of breath, abdominal pain, vision changes. She states she is overall feeling well but is frustrated that her blood pressure is not under control despite the large medication burden.   Past Medical History:  Diagnosis Date  . Diabetes mellitus without complication (HCC)   . Hypertension    Review of Systems:  Review of Systems  Constitutional: Negative for fever.  Eyes: Negative for blurred vision and double vision.  Respiratory: Negative for shortness of breath.   Cardiovascular: Negative for chest pain.  Gastrointestinal: Negative for abdominal pain.     Physical Exam:  Vitals:   08/19/16 1030  BP: (!) 191/71  Pulse: 71  Temp: 98.2 F (36.8 C)  TempSrc: Oral  SpO2: 100%  Weight: 192 lb 6.4 oz (87.3 kg)  Height: 5\' 1"  (1.549 m)   Physical Exam  Constitutional: She is oriented to person, place, and time.  Eyes:  No obvious papilledema on fundoscopic exam  Cardiovascular: Normal rate, regular rhythm, normal heart sounds and intact distal pulses.   Pulmonary/Chest: Effort normal and breath sounds normal. She has no wheezes.  Neurological: She is alert and oriented to person, place, and time.  Skin: Skin is warm and dry. Capillary refill takes less than 2 seconds.    Assessment & Plan:   See Encounters Tab for problem based charting.  Patient seen with Dr. Oswaldo DoneVincent

## 2016-08-19 NOTE — Patient Instructions (Signed)
Thank you for coming in today Leslie White  We are going to add another medicine called spironolactone or Aldactone to try to decrease her blood pressure. He will take 25 mg per day and can take this in the morning with her other medicines. This medicine has been shown to help in people with difficult to control blood pressure and works well in all races. We would like to see you back in one month to check some lab work and to see how your blood pressure is doing.

## 2016-08-19 NOTE — Assessment & Plan Note (Addendum)
Patient reports a history of snoring, along with daytime somnolence. She has a habitus and oropharynx associated with sleep apnea. She expresses interest in a sleep study and we will place referral at this time. Uncontrolled sleep apnea may be contributing to her difficult to control hypertension.  Addendum: Corrected orders for sleep study referral

## 2016-08-20 ENCOUNTER — Other Ambulatory Visit: Payer: Self-pay | Admitting: Internal Medicine

## 2016-08-20 NOTE — Addendum Note (Signed)
Addended by: Erlinda HongVINCENT, Keiffer Piper T on: 08/20/2016 09:01 AM   Modules accepted: Level of Service

## 2016-08-20 NOTE — Addendum Note (Signed)
Addended by: Ginger CarneHARDEN, Salvatrice Morandi A on: 08/20/2016 05:03 PM   Modules accepted: Orders

## 2016-08-20 NOTE — Progress Notes (Signed)
Internal Medicine Clinic Attending  I saw and evaluated the patient.  I personally confirmed the key portions of the history and exam documented by Dr. Harden and I reviewed pertinent patient test results.  The assessment, diagnosis, and plan were formulated together and I agree with the documentation in the resident's note.  

## 2016-10-01 ENCOUNTER — Other Ambulatory Visit: Payer: Self-pay | Admitting: Internal Medicine

## 2016-10-01 DIAGNOSIS — I10 Essential (primary) hypertension: Secondary | ICD-10-CM

## 2016-11-09 ENCOUNTER — Other Ambulatory Visit: Payer: Self-pay | Admitting: Internal Medicine

## 2016-11-09 DIAGNOSIS — I10 Essential (primary) hypertension: Secondary | ICD-10-CM

## 2016-11-16 ENCOUNTER — Other Ambulatory Visit: Payer: Self-pay | Admitting: *Deleted

## 2016-11-16 DIAGNOSIS — I10 Essential (primary) hypertension: Secondary | ICD-10-CM

## 2016-11-17 MED ORDER — HYDRALAZINE HCL 50 MG PO TABS: 50.0000 mg | ORAL_TABLET | Freq: Three times a day (TID) | ORAL | 1 refills | Status: DC

## 2017-01-11 ENCOUNTER — Other Ambulatory Visit: Payer: Self-pay | Admitting: Internal Medicine

## 2017-01-11 DIAGNOSIS — I10 Essential (primary) hypertension: Secondary | ICD-10-CM

## 2017-01-12 NOTE — Telephone Encounter (Signed)
DM last addressed April and uncontrolled. HTN uncontrolled and spironolactone started July but no BMP since then. Needs [facility] appt stat (this week). [facility] for DM and HTN pls reference this note. Pls also sch PCP appt - first open, likely Feb.   Pls tell her to bring in all med bottles, glucometer, BP log.  Thanks

## 2017-01-12 NOTE — Telephone Encounter (Signed)
Called pt to scheduled an appt - no answer; mailbox full - unable to leave message.

## 2017-01-13 NOTE — Telephone Encounter (Signed)
Is she still taking the spironolactone? If at all possible, I would encourage her to go [facility] an urgent [facility] [place] to get a BMP.

## 2017-01-13 NOTE — Telephone Encounter (Signed)
Talked to pt about coming in this week to be seen for HTN/DM f/u per Dr Rogelia BogaButcher. Stated she working 6 days a week in [place]; unable to get off her job - stated her furrnance stop working, so she needs to work. Only day off is Sunday. But she will call back, after checking her work schedule to schedule an appt for the beginning of the year. Stated she wish she could come in sooner; knows how important it is for her heaalth but she cannot at this time; has to pay $5000 for a new furnace.

## 2017-01-14 NOTE — Telephone Encounter (Signed)
Called pt - no answer; VM fulled, unable to leave message.

## 2017-02-12 ENCOUNTER — Telehealth: Payer: Self-pay | Admitting: Internal Medicine

## 2017-02-12 DIAGNOSIS — I10 Essential (primary) hypertension: Secondary | ICD-10-CM

## 2017-02-14 NOTE — Telephone Encounter (Signed)
Pt was started on spironolactone on July 2018 by Dr harden,and followup not obtained for repeat BMET She needs stat appt to recheck labs  She also needs to follow up for her chronic issues- last seen in July- I am not able to refill until a confirmation obtained on when she plans to follow up  Thank you Dr Johnny Bridgesaraiya

## 2017-02-18 NOTE — Telephone Encounter (Signed)
Just attempted to contact patient at the phone number listed on file to get an appointment scheduled, but no answer and voicemail is full. Forwarding back to pcp to make him aware.

## 2017-02-20 ENCOUNTER — Other Ambulatory Visit: Payer: Self-pay | Admitting: Internal Medicine

## 2017-02-20 DIAGNOSIS — I10 Essential (primary) hypertension: Secondary | ICD-10-CM

## 2017-02-22 NOTE — Telephone Encounter (Signed)
[facility].

## 2017-02-25 ENCOUNTER — Encounter: Payer: Self-pay | Admitting: *Deleted

## 2017-03-17 ENCOUNTER — Ambulatory Visit: Payer: BLUE CROSS/BLUE SHIELD

## 2017-03-17 ENCOUNTER — Encounter: Payer: Self-pay | Admitting: Internal Medicine

## 2017-04-01 ENCOUNTER — Other Ambulatory Visit: Payer: Self-pay | Admitting: *Deleted

## 2017-04-01 DIAGNOSIS — I10 Essential (primary) hypertension: Secondary | ICD-10-CM

## 2017-04-01 NOTE — Telephone Encounter (Signed)
Upcoming [facility] appt 04/07/2017. Kinnie FeilL. Ducatte, RN, BSN

## 2017-04-05 MED ORDER — HYDROCHLOROTHIAZIDE 25 MG PO TABS: 25.0000 mg | ORAL_TABLET | Freq: Every day | ORAL | 1 refills | Status: DC

## 2017-04-05 NOTE — Telephone Encounter (Signed)
Refill Approved

## 2017-04-07 ENCOUNTER — Encounter: Payer: Self-pay | Admitting: Internal Medicine

## 2017-04-07 ENCOUNTER — Ambulatory Visit (INDEPENDENT_AMBULATORY_CARE_PROVIDER_SITE_OTHER): Payer: BLUE CROSS/BLUE SHIELD | Admitting: Internal Medicine

## 2017-04-07 VITALS — BP 206/76 | HR 70 | Temp 98.0°F | Wt 190.5 lb

## 2017-04-07 DIAGNOSIS — I10 Essential (primary) hypertension: Secondary | ICD-10-CM | POA: Diagnosis not present

## 2017-04-07 DIAGNOSIS — Z9114 Patient's other noncompliance with medication regimen: Secondary | ICD-10-CM

## 2017-04-07 DIAGNOSIS — Z7984 Long term (current) use of oral hypoglycemic drugs: Secondary | ICD-10-CM

## 2017-04-07 DIAGNOSIS — Z79899 Other long term (current) drug therapy: Secondary | ICD-10-CM

## 2017-04-07 DIAGNOSIS — E119 Type 2 diabetes mellitus without complications: Secondary | ICD-10-CM

## 2017-04-07 LAB — POCT GLYCOSYLATED HEMOGLOBIN (HGB A1C): Hemoglobin A1C: 7.6

## 2017-04-07 LAB — GLUCOSE, CAPILLARY: Glucose-Capillary: 121 mg/dL — ABNORMAL HIGH (ref 65–99)

## 2017-04-07 MED ORDER — NIFEDIPINE ER 90 MG PO TB24: 90.0000 mg | ORAL_TABLET | Freq: Every day | ORAL | 11 refills | Status: DC

## 2017-04-07 NOTE — Assessment & Plan Note (Signed)
Lab Results  Component Value Date   HGBA1C 7.6 04/07/2017    Her A1c today has improved from 10.0% in April 2018 to 7.6% today on metformin 1000mg  BID alone.  She works 6 days per week on her feet at [employer] so is limited in her ability and time to exercise regularly.  She is eating more salad and grilled chicken.  She does have a tendency to have sweets on a consistent basis which is probably her biggest dietary indiscretion.  Plan: - Continue metformin 1000mg  BID for now as much of this visit was focused on her BP control - She is on ACE inhibitor for proteinuria - She is due for foot and eye exam  - Repeat A1c in 3 months

## 2017-04-07 NOTE — Assessment & Plan Note (Addendum)
BP Readings from Last 3 Encounters:  04/07/17 (!) 206/76  08/19/16 (!) 191/71  06/24/16 (!) 178/74   Her blood pressure is unacceptably high today as it has been on previous outpatient visits.  Her initial BP today was 221/91 and when rechecked it was still 206/76.  Surprisingly, she is completely asymptomatic.  Denies headache, vision change, chest pain, shortness of breath.  She reports that she has not been taking her hydralazine TID as prescribed as she thought it was a water pill like HCTZ.  She was started on spironolactone in July and has not had repeat labs since that time.  Other than the hydralazine, she only occasionally misses an evening dose of her metoprolol.  Otherwise, she takes her medications in the morning and took them this morning.    Plan: - she is not on CCB at the moment.  Previously was on amlodipine and then nifedipine at one point.  Nifedipine was stopped due to patient self reported side effects with starting that med at same time as increasing clonidine.  Discussed with her today that it is more likely her symptoms were related to clonidine than the nifedipine.  She is agreeable to try starting back this medication again today. - We also discussed a lot how her uncontrolled HTN is putting her at risk for detrimental outcomes including stroke - Continue Spironolactone 25 mg daily, lisinopril 40 mg daily, metoprolol 100 mg twice a day, hydrochlorothiazide 25 mg daily - STOP Hydralazine 50mg  TID - START Nifedipine 90mg  daily - BMET today - RTC 1-2 weeks

## 2017-04-07 NOTE — Progress Notes (Signed)
CC: here for DM and HTN f/u  HPI:  [name] is a 62 y.o. woman with a past medical history listed below here today for follow up of her HTN and DM.  For details of today's visit and the status of her chronic medical issues please refer to the assessment and plan.   Past Medical History:  Diagnosis Date  . Diabetes mellitus without complication (HCC)   . Hypertension    Review of Systems:  Please see pertinent ROS reviewed in HPI and problem based charting.   Physical Exam:  Vitals:   04/07/17 0854 04/07/17 0928  BP: (!) 221/91 (!) 206/76  Pulse: 77 70  Temp: 98 F (36.7 C)   TempSrc: Oral   SpO2: 99%   Weight: 190 lb 8 oz (86.4 kg)    Physical Exam  Constitutional: She is oriented to person, place, and time and well-developed, well-nourished, and in no distress.  HENT:  Head: Normocephalic and atraumatic.  Eyes: Conjunctivae and EOM are normal. Pupils are equal, round, and reactive to light.  Cardiovascular: Normal rate, regular rhythm and normal heart sounds.  Pulmonary/Chest: Effort normal and breath sounds normal. She has no wheezes. She has no rales.  Neurological: She is alert and oriented to person, place, and time. No cranial nerve deficit.  Skin: Skin is warm and dry.  Psychiatric: Mood and affect normal.     Assessment & Plan:   See Encounters Tab for problem based charting.  Patient discussed with Dr. Josem Kaufmann.  Diabetes mellitus without complication Lab Results  Component Value Date   HGBA1C 7.6 04/07/2017    Her A1c today has improved from 10.0% in April 2018 to 7.6% today on metformin 1000mg  BID alone.  She works 6 days per week on her feet at [employer] so is limited in her ability and time to exercise regularly.  She is eating more salad and grilled chicken.  She does have a tendency to have sweets on a consistent basis which is probably her biggest dietary indiscretion.  Plan: - Continue metformin 1000mg  BID for now as much of this  visit was focused on her BP control - She is on ACE inhibitor for proteinuria - She is due for foot and eye exam  - Repeat A1c in 3 months  Hypertension BP Readings from Last 3 Encounters:  04/07/17 (!) 206/76  08/19/16 (!) 191/71  06/24/16 (!) 178/74   Her blood pressure is unacceptably high today as it has been on previous outpatient visits.  Her initial BP today was 221/91 and when rechecked it was still 206/76.  Surprisingly, she is completely asymptomatic.  Denies headache, vision change, chest pain, shortness of breath.  She reports that she has not been taking her hydralazine TID as prescribed as she thought it was a water pill like HCTZ.  She was started on spironolactone in July and has not had repeat labs since that time.  Other than the hydralazine, she only occasionally misses an evening dose of her metoprolol.  Otherwise, she takes her medications in the morning and took them this morning.    Plan: - she is not on CCB at the moment.  Previously was on amlodipine and then nifedipine at one point.  Nifedipine was stopped due to patient self reported side effects with starting that med at same time as increasing clonidine.  Discussed with her today that it is more likely her symptoms were related to clonidine than the nifedipine.  She is agreeable to try  starting back this medication again today. - We also discussed a lot how her uncontrolled HTN is putting her at risk for detrimental outcomes including stroke - Continue Spironolactone 25 mg daily, lisinopril 40 mg daily, metoprolol 100 mg twice a day, hydrochlorothiazide 25 mg daily - STOP Hydralazine 50mg  TID - START Nifedipine 90mg  daily - BMET today - RTC 1-2 weeks

## 2017-04-07 NOTE — Patient Instructions (Addendum)
FOLLOW-UP INSTRUCTIONS When: 1-2 weeks For: blood pressure follow What to bring: current medications  It was a pleasure to meet you today.  Your doing much better with your diabetes.  Congratulations!  Your blood pressure is very high and not under control.  In fact, it is dangerously high.    You are prescribed 5 blood pressure medications to take: 1. Nifedipine 90mg  daily (THIS IS A NEW MEDICATION STARTING TODAY) 2. Lisinopril 40mg  daily 3. Metoprolol 100mg  twice per day 4. Hydrochlorothiazide 25mg  daily 5. Spironolactone 25mg  daily  STOP TAKING HYDRALAZINE THREE TIMES PER DAY

## 2017-04-08 LAB — BMP8+ANION GAP
Anion Gap: 17 mmol/L (ref 10.0–18.0)
BUN / CREAT RATIO: 25 (ref 12–28)
BUN: 21 mg/dL (ref 8–27)
CHLORIDE: 101 mmol/L (ref 96–106)
CO2: 24 mmol/L (ref 20–29)
Calcium: 10.2 mg/dL (ref 8.7–10.3)
Creatinine, Ser: 0.85 mg/dL (ref 0.57–1.00)
GFR calc non Af Amer: 74 mL/min/{1.73_m2} (ref >59)
GFR, EST AFRICAN AMERICAN: 85 mL/min/{1.73_m2} (ref >59)
GLUCOSE: 129 mg/dL — AB (ref 65–99)
Potassium: 4.1 mmol/L (ref 3.5–5.2)
SODIUM: 142 mmol/L (ref 134–144)

## 2017-04-08 NOTE — Progress Notes (Signed)
Case discussed with Dr. Earlene PlaterWallace at the time of the visit.  We reviewed the resident's history and exam and pertinent patient test results.  I agree with the assessment, diagnosis and plan of care documented in the resident's note.  If she tolerates the nifedipine at 90 mg daily and still requires more aggressive antihypertensive management the nifedipine can be increased to 120 mg daily at the follow-up visit.

## 2017-05-17 ENCOUNTER — Other Ambulatory Visit: Payer: Self-pay | Admitting: Internal Medicine

## 2017-05-17 NOTE — Telephone Encounter (Signed)
This medication was reordered by Dr. Earlene PlaterWallace on 04/07/17 with 11 refills. Refill for this instance of medication refused as patient already has separate prescription for this.

## 2017-08-12 ENCOUNTER — Other Ambulatory Visit: Payer: Self-pay | Admitting: Internal Medicine

## 2017-08-12 DIAGNOSIS — I10 Essential (primary) hypertension: Secondary | ICD-10-CM

## 2017-08-12 NOTE — Telephone Encounter (Signed)
She was supposed to be seen back in march for very uncontrolled blood pressure.  Please call and have her schedule an appointment.  Also, is the pharmacy in [place] the right one?    Thanks!

## 2017-08-12 NOTE — Telephone Encounter (Signed)
Called pt - no answer; left message to call the office for f/u appt. 

## 2017-08-13 NOTE — Telephone Encounter (Signed)
Called pt again - no answer. I will send message to front office to try to contact pt.

## 2017-08-13 NOTE — Telephone Encounter (Signed)
Okay.  Thank you.  I sent in the Rx prior.  Please have her make an appointment as she can with her schedule.  Thanks.

## 2017-08-13 NOTE — Telephone Encounter (Signed)
Call from pt - informed at last visit, her BP was elevated and and needs to schedule an apt . Stated she works 6 days a week/fulltime and she has to wait until their schedule is out and she will call back. Also stated she cannot tolerate "the new pill" ; she had told the doctor -  she had taken it before. She works at [employer]; [facility]

## 2017-09-21 ENCOUNTER — Other Ambulatory Visit: Payer: Self-pay | Admitting: Internal Medicine

## 2017-09-21 DIAGNOSIS — I10 Essential (primary) hypertension: Secondary | ICD-10-CM

## 2017-09-22 NOTE — Telephone Encounter (Signed)
Refill Approved

## 2017-10-25 ENCOUNTER — Other Ambulatory Visit: Payer: Self-pay | Admitting: Internal Medicine

## 2017-10-25 DIAGNOSIS — I10 Essential (primary) hypertension: Secondary | ICD-10-CM

## 2017-10-26 NOTE — Telephone Encounter (Signed)
Refill approved.

## 2017-10-26 NOTE — Telephone Encounter (Signed)
Refills approved.

## 2017-12-29 ENCOUNTER — Encounter: Payer: BLUE CROSS/BLUE SHIELD | Admitting: Internal Medicine

## 2018-01-03 ENCOUNTER — Ambulatory Visit (INDEPENDENT_AMBULATORY_CARE_PROVIDER_SITE_OTHER): Payer: BLUE CROSS/BLUE SHIELD | Admitting: Internal Medicine

## 2018-01-03 VITALS — BP 146/66 | HR 80 | Temp 98.5°F | Wt 188.6 lb

## 2018-01-03 DIAGNOSIS — I10 Essential (primary) hypertension: Secondary | ICD-10-CM

## 2018-01-03 DIAGNOSIS — R011 Cardiac murmur, unspecified: Secondary | ICD-10-CM | POA: Diagnosis not present

## 2018-01-03 DIAGNOSIS — E119 Type 2 diabetes mellitus without complications: Secondary | ICD-10-CM | POA: Diagnosis not present

## 2018-01-03 DIAGNOSIS — Z79899 Other long term (current) drug therapy: Secondary | ICD-10-CM

## 2018-01-03 DIAGNOSIS — Z7984 Long term (current) use of oral hypoglycemic drugs: Secondary | ICD-10-CM

## 2018-01-03 LAB — GLUCOSE, CAPILLARY: Glucose-Capillary: 129 mg/dL — ABNORMAL HIGH (ref 70–99)

## 2018-01-03 LAB — POCT GLYCOSYLATED HEMOGLOBIN (HGB A1C): HEMOGLOBIN A1C: 8 % — AB (ref 4.0–5.6)

## 2018-01-03 NOTE — Assessment & Plan Note (Addendum)
BP today 146/66 on recheck. This is improved from previous of 170s-200s systolic in the past year. She has multiple medication changes last year but reports she is currently taking her medications. Hesitant to make medication changes before verifying what prescriptions she has picked up from her pharmacy as this information is not in our EMR, will contact her pharmacy. - Spironolactone 25mg  Daily - Llisinopril40mg  Daily - Metoprolol 100mg  BID - Hydrochlorothiazide 25mg  Daily - Nifedipine 90mg  Daily  ADDENDUM: Contacted patients pharmacy. She has recently picked up Lisinopril, Metoprolol, and HCTZ. She has not picked up Nifedipine since April of 2019 and There has not been and active prescription for Spironolactone since 2018.  Contacted patient by phone and instructed her to begin Nifedipine. Patient states she has not been taking Nifedipine consistently as she feels it sometimes give her a "funny feeling" in her head. She states she would prefer to restart Nifedipine daily instead of switching back to spironolactone. Will restart Nifedipine and discontinue spironolactone for now and reassess at follow up: - Lisinopril40mg  Daily - Metoprolol 100mg  BID - Hydrochlorothiazide 25mg  Daily - Nifedipine 90mg  Daily

## 2018-01-03 NOTE — Progress Notes (Signed)
   CC: Hypertension, Diabetes  HPI:  [name] is a [age] y.o. F with PMHx listed below presenting for Hypertension, Diabetes. Please see the A&P for the status of the patient's chronic medical problems.    Past Medical History:  Diagnosis Date  . Diabetes mellitus without complication (HCC)   . Hypertension    Review of Systems:  Performed and all others negative.  Physical Exam:  Vitals:   01/03/18 1015 01/03/18 1038  BP: (!) 194/75 (!) 146/66  Pulse: 81 80  Temp: 98.5 F (36.9 C)   TempSrc: Oral   SpO2: 99%   Weight: 188 lb 9.6 oz (85.5 kg)    Physical Exam  Constitutional: She appears well-developed and well-nourished. No distress.  Cardiovascular: Normal rate, regular rhythm and intact distal pulses.  No murmur heard. Distant systolic murmur  Pulmonary/Chest: Effort normal and breath sounds normal. No respiratory distress.  Abdominal: Soft. Bowel sounds are normal. She exhibits no distension. There is no tenderness.  Musculoskeletal: She exhibits no edema or deformity.  Skin: Skin is warm and dry.    Assessment & Plan:   See Encounters Tab for problem based charting.  Patient discussed with Dr. Rogelia BogaButcher

## 2018-01-03 NOTE — Patient Instructions (Addendum)
Thank you for allowing us to care for you  For your blood pressure - Blood pressure is improving on current medications - Continue your current medications  For your diabetes - Your A1C is increased today at 8 - Continue metformin  - Work on diet and exercise changes  Follow up in 6 months as discussed

## 2018-01-04 ENCOUNTER — Encounter: Payer: Self-pay | Admitting: Internal Medicine

## 2018-01-04 NOTE — Assessment & Plan Note (Signed)
A1c today is 8.0, this is increased from previous of 7.6. Patient states she is taking her metformin as prescribed without side effect. Adding an additional agent was discussed considering her increase in A1C. She declines additional medication at this time stating she would like to work to improve her A1c with diet and exercise. If she is unable to significantly improve her A1c she states she will be agreeable to starting a second agent at her follow up, an SGLT-2 inhibor would be a good option. - Metformin 1000mg  BID - Lifestyle Modifications - Follow up in 6 months (3 months follow was encourage but patient does not feel she will able to do this with her work and agrees to 6 month follow ups)

## 2018-01-04 NOTE — Progress Notes (Signed)
Internal Medicine Clinic Attending  Case discussed with Dr. Melvin  at the time of the visit.  We reviewed the resident's history and exam and pertinent patient test results.  I agree with the assessment, diagnosis, and plan of care documented in the resident's note.  

## 2018-01-04 NOTE — Addendum Note (Signed)
Addended by: Beola CordMELVIN, ALEXANDER B on: 01/04/2018 10:20 AM   Modules accepted: Orders

## 2018-04-08 ENCOUNTER — Encounter: Payer: Self-pay | Admitting: Internal Medicine

## 2018-04-17 ENCOUNTER — Other Ambulatory Visit: Payer: Self-pay | Admitting: Internal Medicine

## 2018-04-17 DIAGNOSIS — I10 Essential (primary) hypertension: Secondary | ICD-10-CM

## 2018-04-17 DIAGNOSIS — E119 Type 2 diabetes mellitus without complications: Secondary | ICD-10-CM

## 2018-07-21 ENCOUNTER — Other Ambulatory Visit: Payer: Self-pay | Admitting: Internal Medicine

## 2018-07-21 DIAGNOSIS — I10 Essential (primary) hypertension: Secondary | ICD-10-CM

## 2018-07-21 DIAGNOSIS — E119 Type 2 diabetes mellitus without complications: Secondary | ICD-10-CM

## 2018-08-11 ENCOUNTER — Telehealth: Payer: Self-pay

## 2018-08-11 NOTE — Telephone Encounter (Signed)
Pt states grandson Hotel manager tested positive for COVID-19. Requesting to speak with a nurse about getting tested for COVID-19. Please call back.

## 2018-08-11 NOTE — Telephone Encounter (Signed)
telehealth tomorrow am

## 2018-08-11 NOTE — Telephone Encounter (Signed)
Pt states her grandson, age 64 goes to daycare and his daycare teacher has tested +POSITIVE+ for COVID. She is very upset, she has lots of contact with her grandson, she kept him yesterday because she was off from work. She works in Geologist, engineering at [employer]. She is calling her work to inform them. She wants to know what to do. She has no symptoms. She is very distraught. Ph# [phone]

## 2018-08-11 NOTE — Telephone Encounter (Signed)
Thank you. When we talk to her, we can provide some reassurance that it seems transmission from kids to adult family members is less likely.

## 2018-08-12 ENCOUNTER — Telehealth: Payer: Self-pay | Admitting: Internal Medicine

## 2018-08-12 ENCOUNTER — Ambulatory Visit: Payer: BLUE CROSS/BLUE SHIELD | Admitting: Internal Medicine

## 2018-08-12 NOTE — Telephone Encounter (Signed)
-----   Message from Dewayne Hatch, MD sent at 08/12/2018  9:51 AM EDT ----- Regarding: Tele visit I tried to reach Ms. Thompson-price few times so far but no answer. Will try again in 15 and 30 minutes. I left a voice mail and asked her to call [phone] as well.  Dear Holley Raring, please let me know if she calls and says when she expects our call and can answer.  Thank you

## 2018-08-12 NOTE — Telephone Encounter (Signed)
Called pt - explained she has a telehealth visit this morning and the doctor has calling her. Stated she's at work; I asked if we need to cancel her appt, she stated "yes" and issue has been "taking care of".

## 2018-08-12 NOTE — Telephone Encounter (Signed)
I called Leslie White for her tele visit, 7 times this morning. No answer. Left voice message and asked her to give [place] a call.  ADDENDUM: patient would like to cancel the appointment.

## 2018-08-17 ENCOUNTER — Other Ambulatory Visit: Payer: Self-pay | Admitting: Internal Medicine

## 2018-08-17 ENCOUNTER — Encounter: Payer: BLUE CROSS/BLUE SHIELD | Admitting: Internal Medicine

## 2018-08-17 DIAGNOSIS — I10 Essential (primary) hypertension: Secondary | ICD-10-CM

## 2018-10-13 ENCOUNTER — Other Ambulatory Visit: Payer: Self-pay | Admitting: Internal Medicine

## 2018-10-13 DIAGNOSIS — I10 Essential (primary) hypertension: Secondary | ICD-10-CM

## 2018-11-02 ENCOUNTER — Encounter: Payer: BC Managed Care – PPO | Admitting: Internal Medicine

## 2018-11-12 ENCOUNTER — Other Ambulatory Visit: Payer: Self-pay | Admitting: Internal Medicine

## 2018-11-12 DIAGNOSIS — I10 Essential (primary) hypertension: Secondary | ICD-10-CM

## 2018-11-14 NOTE — Telephone Encounter (Signed)
Next appt scheduled 10/21 in [facility].

## 2018-11-16 ENCOUNTER — Other Ambulatory Visit: Payer: Self-pay

## 2018-11-16 ENCOUNTER — Ambulatory Visit (INDEPENDENT_AMBULATORY_CARE_PROVIDER_SITE_OTHER): Payer: BC Managed Care – PPO | Admitting: Internal Medicine

## 2018-11-16 ENCOUNTER — Encounter: Payer: Self-pay | Admitting: Internal Medicine

## 2018-11-16 VITALS — BP 179/76 | HR 79 | Temp 98.4°F | Wt 192.2 lb

## 2018-11-16 DIAGNOSIS — Z2821 Immunization not carried out because of patient refusal: Secondary | ICD-10-CM | POA: Diagnosis not present

## 2018-11-16 DIAGNOSIS — Z114 Encounter for screening for human immunodeficiency virus [HIV]: Secondary | ICD-10-CM

## 2018-11-16 DIAGNOSIS — I10 Essential (primary) hypertension: Secondary | ICD-10-CM

## 2018-11-16 DIAGNOSIS — Z7984 Long term (current) use of oral hypoglycemic drugs: Secondary | ICD-10-CM | POA: Diagnosis not present

## 2018-11-16 DIAGNOSIS — Z1159 Encounter for screening for other viral diseases: Secondary | ICD-10-CM

## 2018-11-16 DIAGNOSIS — E119 Type 2 diabetes mellitus without complications: Secondary | ICD-10-CM | POA: Diagnosis not present

## 2018-11-16 DIAGNOSIS — Z79899 Other long term (current) drug therapy: Secondary | ICD-10-CM

## 2018-11-16 DIAGNOSIS — Z0000 Encounter for general adult medical examination without abnormal findings: Secondary | ICD-10-CM

## 2018-11-16 LAB — POCT GLYCOSYLATED HEMOGLOBIN (HGB A1C): Hemoglobin A1C: 8.6 % — AB (ref 4.0–5.6)

## 2018-11-16 LAB — GLUCOSE, CAPILLARY: Glucose-Capillary: 187 mg/dL — ABNORMAL HIGH (ref 70–99)

## 2018-11-16 MED ORDER — JARDIANCE 10 MG PO TABS: 10.0000 mg | ORAL_TABLET | Freq: Every day | ORAL | 3 refills | Status: DC

## 2018-11-16 MED ORDER — SPIRONOLACTONE 25 MG PO TABS: 25.0000 mg | ORAL_TABLET | Freq: Every day | ORAL | 3 refills | Status: DC

## 2018-11-16 NOTE — Progress Notes (Signed)
   CC: Hypertension, Diabetes, preventative health care  HPI:  [name] is a 63 y.o. F with PMHx listed below presenting for Hypertension, Diabetes, preventative health care. Please see the A&P for the status of the patient's chronic medical problems.  Past Medical History:  Diagnosis Date  . Diabetes mellitus without complication ([place])   . Hypertension    Review of Systems:  Performed and all others negative.  Physical Exam:  Vitals:   11/16/18 0905 11/16/18 0931  BP: (!) 180/77 (!) 179/76  Pulse: 83 79  Temp: 98.4 F (36.9 C)   TempSrc: Oral   SpO2: 100%   Weight: 192 lb 3.2 oz (87.2 kg)    Physical Exam Constitutional:      General: She is not in acute distress.    Appearance: Normal appearance. She is obese.  Cardiovascular:     Rate and Rhythm: Normal rate and regular rhythm.     Pulses: Normal pulses.     Heart sounds: Normal heart sounds.  Pulmonary:     Effort: Pulmonary effort is normal. No respiratory distress.     Breath sounds: Normal breath sounds.  Abdominal:     General: Bowel sounds are normal. There is no distension.     Palpations: Abdomen is soft.     Tenderness: There is no abdominal tenderness.  Musculoskeletal:        General: No swelling or deformity.  Skin:    General: Skin is warm and dry.  Neurological:     General: No focal deficit present.     Mental Status: Mental status is at baseline.    Assessment & Plan:   See Encounters Tab for problem based charting.  Patient discussed with Dr. Heber Greenup

## 2018-11-16 NOTE — Assessment & Plan Note (Addendum)
BP today 180/77. This is increased from previous of 146/66. She states she has been taking her medications regularly. Renin and Aldo have been checked in the past with elevated ration but no elevation in Aldo. Will resume spironolactone and obtain baseline BMP. She has concern about being on so many medications and possible side effects, she was reassured but remains hesitant, she plans to start the medication provided her lab work returns stable. - Lisinopril40mg  Daily - Metoprolol 100mg  BID - Hydrochlorothiazide 25mg  Daily - Nifedipine 90mg  Daily - Start Spironolactone 25mg  Daily  ADDENDUM: BMP wnl with mildly elevated Ca - Patient updated by phone and is to follow up in the next 1-2 months for BP recheck and BMP to monitor response to Spironolactone. She is agreeable, but note continued concern for the effect the medicine will have on her kidnies.

## 2018-11-16 NOTE — Assessment & Plan Note (Addendum)
A1c today is 8.6, this is increased from previous of 8. She has been unable to improve A1c with lifestyle modifications in addition to Metformin, will initiate second agent as discussed at her last visit. SGLT-2-I will be a good second agent for her. She remains hesitant to start a second medication and state she is unsure when she will start, but she is okay with having the order placed. - Metformin 1000mg  BID - Start Jardiance 10mg  Daily - Continue lifestyle modifications - Follow up with PCP in 3 months

## 2018-11-16 NOTE — Patient Instructions (Addendum)
Thank you for allowing [place] to care for you  For your high blood pressure - BP elevated today - Continue current medications - Please start spironolactone 25mg  Daily  For your Diabetes - A1c elevated today at 8.6 - Continue Metformin - Please start taking Jardiance 10mg  Daily  We are checking labs today, you will be contacted with the results  Flu shot given today  Please follow up in about 3 months

## 2018-11-16 NOTE — Progress Notes (Signed)
Internal Medicine Clinic Attending  Case discussed with Dr. Melvin  at the time of the visit.  We reviewed the resident's history and exam and pertinent patient test results.  I agree with the assessment, diagnosis, and plan of care documented in the resident's note.  

## 2018-11-16 NOTE — Assessment & Plan Note (Signed)
-   Refuses Flu shot - Hep A and HIV screen - Foot exam done - Will schedule eye exam - TDAP done at pharmacy, she will send records

## 2018-11-17 ENCOUNTER — Telehealth: Payer: Self-pay | Admitting: Internal Medicine

## 2018-11-17 LAB — BMP8+ANION GAP
Anion Gap: 16 mmol/L (ref 10.0–18.0)
BUN/Creatinine Ratio: 21 (ref 12–28)
BUN: 18 mg/dL (ref 8–27)
CO2: 23 mmol/L (ref 20–29)
Calcium: 10.8 mg/dL — ABNORMAL HIGH (ref 8.7–10.3)
Chloride: 99 mmol/L (ref 96–106)
Creatinine, Ser: 0.84 mg/dL (ref 0.57–1.00)
GFR calc Af Amer: 86 mL/min/{1.73_m2} (ref >59)
GFR calc non Af Amer: 74 mL/min/{1.73_m2} (ref >59)
Glucose: 178 mg/dL — ABNORMAL HIGH (ref 65–99)
Potassium: 4.2 mmol/L (ref 3.5–5.2)
Sodium: 138 mmol/L (ref 134–144)

## 2018-11-17 LAB — HIV ANTIBODY (ROUTINE TESTING W REFLEX): HIV Screen 4th Generation wRfx: NONREACTIVE

## 2018-11-17 LAB — HEPATITIS C ANTIBODY: Hep C Virus Ab: <0.1 s/co ratio (ref 0.0–0.9)

## 2018-11-17 NOTE — Telephone Encounter (Signed)
Patient contacted to inform her of her negative HIV and Hep C screen. Also informed of mildly elevated Ca and otherwise normal BMP. She was encourage to begin spironolactone based on these changes. We will have her come back sooner to help ease her concerns and recheck labs in about 1 month.

## 2019-01-11 ENCOUNTER — Other Ambulatory Visit: Payer: Self-pay | Admitting: Internal Medicine

## 2019-01-11 DIAGNOSIS — E119 Type 2 diabetes mellitus without complications: Secondary | ICD-10-CM

## 2019-01-11 DIAGNOSIS — I10 Essential (primary) hypertension: Secondary | ICD-10-CM

## 2019-02-15 ENCOUNTER — Encounter: Payer: BC Managed Care – PPO | Admitting: Internal Medicine

## 2019-05-22 ENCOUNTER — Telehealth: Payer: Self-pay

## 2019-05-22 NOTE — Telephone Encounter (Signed)
Requesting a note for work, please call pt back.  

## 2019-05-22 NOTE — Telephone Encounter (Signed)
Called pt, states her Writer at [employer] where she has been for 35 years is mad because she takes too many bathroom breaks, states he said she had to get a letter or he was getting rid of her. ACC 5/19 at 0845- her choosing

## 2019-06-10 ENCOUNTER — Other Ambulatory Visit: Payer: Self-pay | Admitting: Internal Medicine

## 2019-06-10 DIAGNOSIS — I10 Essential (primary) hypertension: Secondary | ICD-10-CM

## 2019-06-14 ENCOUNTER — Other Ambulatory Visit: Payer: Self-pay

## 2019-06-14 ENCOUNTER — Ambulatory Visit (INDEPENDENT_AMBULATORY_CARE_PROVIDER_SITE_OTHER): Payer: BC Managed Care – PPO | Admitting: Internal Medicine

## 2019-06-14 VITALS — BP 173/70 | Temp 99.2°F | Ht 61.5 in | Wt 185.7 lb

## 2019-06-14 DIAGNOSIS — Z0000 Encounter for general adult medical examination without abnormal findings: Secondary | ICD-10-CM | POA: Diagnosis not present

## 2019-06-14 DIAGNOSIS — E119 Type 2 diabetes mellitus without complications: Secondary | ICD-10-CM

## 2019-06-14 DIAGNOSIS — R350 Frequency of micturition: Secondary | ICD-10-CM | POA: Insufficient documentation

## 2019-06-14 DIAGNOSIS — I10 Essential (primary) hypertension: Secondary | ICD-10-CM | POA: Diagnosis not present

## 2019-06-14 DIAGNOSIS — Z7984 Long term (current) use of oral hypoglycemic drugs: Secondary | ICD-10-CM

## 2019-06-14 LAB — POCT GLYCOSYLATED HEMOGLOBIN (HGB A1C): Hemoglobin A1C: 8.4 % — AB (ref 4.0–5.6)

## 2019-06-14 LAB — GLUCOSE, CAPILLARY: Glucose-Capillary: 144 mg/dL — ABNORMAL HIGH (ref 70–99)

## 2019-06-14 MED ORDER — METOPROLOL TARTRATE 100 MG PO TABS: 100.0000 mg | ORAL_TABLET | Freq: Two times a day (BID) | ORAL | 3 refills | Status: DC

## 2019-06-14 MED ORDER — METFORMIN HCL 1000 MG PO TABS: 1000.0000 mg | ORAL_TABLET | Freq: Two times a day (BID) | ORAL | 3 refills | Status: DC

## 2019-06-14 MED ORDER — LISINOPRIL 40 MG PO TABS: 40.0000 mg | ORAL_TABLET | Freq: Every day | ORAL | 3 refills | Status: DC

## 2019-06-14 MED ORDER — HYDROCHLOROTHIAZIDE 25 MG PO TABS: 25.0000 mg | ORAL_TABLET | Freq: Every day | ORAL | 2 refills | Status: DC

## 2019-06-14 NOTE — Patient Instructions (Addendum)
Leslie White,  It was a pleasure seeing you in clinic. Today we discussed:  Blood pressure: Please continue to take your medications as prescribed and follow up in 4 weeks for BP check and labs. Please follow up with your PCP.   Diabetes: I will check your A1c today. Please continue to take your metformin as prescribed. You may need to add on Jardiance to your diabetes regimen. I will call you to discuss this pending results of your A1c.   I have attached a work note. Please let me know if you need anything else.   Please contact us if you have any questions or concerns.  Thank you!

## 2019-06-14 NOTE — Progress Notes (Signed)
Internal Medicine Clinic Attending  Case discussed with Dr. Aslam at the time of the visit.  We reviewed the resident's history and exam and pertinent patient test results.  I agree with the assessment, diagnosis, and plan of care documented in the resident's note.  

## 2019-06-14 NOTE — Assessment & Plan Note (Signed)
Patient previously prescribed statin, however not taking.  Discussed that this is important in prevention of stroke or MI given her history of hypertension diabetes.  Patient would not like to add on her statin at this time.  Lipid panel from 4 years ago wnl.  Will repeat lipid panel today and continue to encourage statin use.

## 2019-06-14 NOTE — Assessment & Plan Note (Addendum)
Patient notes that she has had urinary frequency, especially morning diuresis between 6 and 9 AM.  This is unchanged from baseline and no further urinary symptoms.  Her urinary frequency is likely in setting of diuretic and diabetes. she notes that due to change in management, she is not allowed to use the restroom as frequently as required.  She is requesting work note  -Work note provided

## 2019-06-14 NOTE — Progress Notes (Signed)
   CC: frequent urination  HPI:  Leslie White is a 64 y.o. female with PMHx of hypertension and diabetes presenting with frequent urination. She notes that this is at baseline; however, unable to use the restroom as frequently at work due to new management. Patient requesting work note.  Please see problem based charting for further assessment and plan.   Past Medical History:  Diagnosis Date  . Diabetes mellitus without complication (HCC)   . Hypertension    Review of Systems:  Negative except as stated in HPI.   Physical Exam:  Vitals:   06/14/19 0858  BP: (!) 166/60  Temp: 99.2 F (37.3 C)  TempSrc: Oral  SpO2: 98%  Weight: 185 lb 11.2 oz (84.2 kg)  Height: 5' 1.5" (1.562 m)   Physical Exam Constitutional:      Appearance: Normal appearance. She is not ill-appearing.  HENT:     Mouth/Throat:     Mouth: Mucous membranes are moist.     Pharynx: Oropharynx is clear.  Cardiovascular:     Rate and Rhythm: Normal rate and regular rhythm.     Pulses: Normal pulses.     Heart sounds: Normal heart sounds. No murmur. No friction rub. No gallop.   Pulmonary:     Effort: Pulmonary effort is normal. No respiratory distress.     Breath sounds: Normal breath sounds. No wheezing, rhonchi or rales.  Skin:    General: Skin is warm and dry.     Capillary Refill: Capillary refill takes less than 2 seconds.     Findings: No bruising or lesion.  Neurological:     General: No focal deficit present.     Mental Status: She is alert and oriented to person, place, and time.     Sensory: No sensory deficit.     Motor: No weakness.      Assessment & Plan:   See Encounters Tab for problem based charting.  Patient discussed with Dr. Oswaldo Done

## 2019-06-14 NOTE — Assessment & Plan Note (Signed)
Hemoglobin A1c 8.6 at last visit for which patient was started on Jardiance 10 mg daily in addition to her Metformin 1000 mg twice daily.  Patient notes that she has not been taking the Jardiance as she is concerned about the side effects of the medication.  Discussed that [place] Pepper is relatively safe and would be beneficial in her case.  She remains hesitant at this time.  Discussed that we will check A1c at this visit and pending results, make further recommendations.  Patient is agreeable  -Hemoglobin A1c today -Continue Metformin 1000 mg twice daily -Continue lifestyle modification -Follow-up with PCP in 3 months

## 2019-06-14 NOTE — Assessment & Plan Note (Signed)
BP 166/60, 173/70 on repeat.  This is slightly improved from previous visit, however still elevated.  Patient notes medication compliance with only metoprolol, lisinopril and hydrochlorothiazide at this time.  She remains hesitant to increase her medication.  Discussed risk of stroke or MI and patient expresses understanding, however would like to continue with current regimen.  I also encouraged lifestyle modification which patient is willing to try.  -Lisinopril 40 mg daily -Metoprolol 100 mg twice daily -Hydrochlorothiazide 25 mg daily -BP check in 4 weeks -BMP next visit

## 2019-06-15 LAB — LIPID PANEL
Chol/HDL Ratio: 4.3 ratio (ref 0.0–4.4)
Cholesterol, Total: 191 mg/dL (ref 100–199)
HDL: 44 mg/dL (ref >39)
LDL Chol Calc (NIH): 132 mg/dL — ABNORMAL HIGH (ref 0–99)
Triglycerides: 80 mg/dL (ref 0–149)
VLDL Cholesterol Cal: 15 mg/dL (ref 5–40)

## 2019-08-10 ENCOUNTER — Telehealth: Payer: Self-pay | Admitting: *Deleted

## 2019-08-10 DIAGNOSIS — I10 Essential (primary) hypertension: Secondary | ICD-10-CM

## 2019-08-11 MED ORDER — METOPROLOL TARTRATE 100 MG PO TABS: 100.0000 mg | ORAL_TABLET | Freq: Two times a day (BID) | ORAL | 2 refills | Status: DC

## 2019-08-11 NOTE — Telephone Encounter (Signed)
Please have patient schedule a follow up appointment for BP. Thank you!  

## 2019-08-11 NOTE — Telephone Encounter (Signed)
Please have patient schedule a follow up appointment for BP. Thank you!

## 2019-08-11 NOTE — Telephone Encounter (Signed)
Attempted to contact patient for an appointment in August with Dr. Antony Contras.  No answer left detailed message asking patient to please call the clinic back to schedule.  Forwarding back to PCP and triage.

## 2019-10-05 ENCOUNTER — Other Ambulatory Visit: Payer: Self-pay | Admitting: Internal Medicine

## 2019-10-05 DIAGNOSIS — I10 Essential (primary) hypertension: Secondary | ICD-10-CM

## 2019-10-23 ENCOUNTER — Encounter: Payer: BC Managed Care – PPO | Admitting: Internal Medicine

## 2019-10-23 ENCOUNTER — Telehealth: Payer: Self-pay

## 2019-12-16 ENCOUNTER — Emergency Department (HOSPITAL_COMMUNITY)
Admission: EM | Admit: 2019-12-16 | Discharge: 2019-12-16 | Disposition: A | Discharge status: Home / Self Care | Payer: BC Managed Care – PPO | Attending: Emergency Medicine | Admitting: Emergency Medicine

## 2019-12-16 ENCOUNTER — Other Ambulatory Visit: Payer: Self-pay

## 2019-12-16 ENCOUNTER — Emergency Department (HOSPITAL_COMMUNITY): Payer: BC Managed Care – PPO

## 2019-12-16 ENCOUNTER — Encounter (HOSPITAL_COMMUNITY): Payer: Self-pay | Admitting: *Deleted

## 2019-12-16 DIAGNOSIS — I160 Hypertensive urgency: Secondary | ICD-10-CM | POA: Diagnosis not present

## 2019-12-16 DIAGNOSIS — E119 Type 2 diabetes mellitus without complications: Secondary | ICD-10-CM | POA: Insufficient documentation

## 2019-12-16 DIAGNOSIS — I10 Essential (primary) hypertension: Secondary | ICD-10-CM | POA: Insufficient documentation

## 2019-12-16 DIAGNOSIS — M25512 Pain in left shoulder: Secondary | ICD-10-CM | POA: Diagnosis not present

## 2019-12-16 DIAGNOSIS — Z79899 Other long term (current) drug therapy: Secondary | ICD-10-CM | POA: Diagnosis not present

## 2019-12-16 DIAGNOSIS — Z794 Long term (current) use of insulin: Secondary | ICD-10-CM | POA: Insufficient documentation

## 2019-12-16 DIAGNOSIS — Z87891 Personal history of nicotine dependence: Secondary | ICD-10-CM | POA: Insufficient documentation

## 2019-12-16 LAB — CBC WITH DIFFERENTIAL/PLATELET
Abs Immature Granulocytes: 0.01 10*3/uL (ref 0.00–0.07)
Basophils Absolute: 0 10*3/uL (ref 0.0–0.1)
Basophils Relative: 1 %
Eosinophils Absolute: 0.2 10*3/uL (ref 0.0–0.5)
Eosinophils Relative: 4 %
HCT: 41 % (ref 36.0–46.0)
Hemoglobin: 13 g/dL (ref 12.0–15.0)
Immature Granulocytes: 0 %
Lymphocytes Relative: 28 %
Lymphs Abs: 1.7 10*3/uL (ref 0.7–4.0)
MCH: 28.8 pg (ref 26.0–34.0)
MCHC: 31.7 g/dL (ref 30.0–36.0)
MCV: 90.9 fL (ref 80.0–100.0)
Monocytes Absolute: 0.5 10*3/uL (ref 0.1–1.0)
Monocytes Relative: 9 %
Neutro Abs: 3.5 10*3/uL (ref 1.7–7.7)
Neutrophils Relative %: 58 %
Platelets: 200 10*3/uL (ref 150–400)
RBC: 4.51 MIL/uL (ref 3.87–5.11)
RDW: 12.5 % (ref 11.5–15.5)
WBC: 5.9 10*3/uL (ref 4.0–10.5)
nRBC: 0 % (ref 0.0–0.2)

## 2019-12-16 LAB — BASIC METABOLIC PANEL
Anion gap: 12 (ref 5–15)
BUN: 20 mg/dL (ref 8–23)
CO2: 24 mmol/L (ref 22–32)
Calcium: 9.7 mg/dL (ref 8.9–10.3)
Chloride: 103 mmol/L (ref 98–111)
Creatinine, Ser: 0.85 mg/dL (ref 0.44–1.00)
GFR, Estimated: >60 mL/min (ref >60)
Glucose, Bld: 134 mg/dL — ABNORMAL HIGH (ref 70–99)
Potassium: 4 mmol/L (ref 3.5–5.1)
Sodium: 139 mmol/L (ref 135–145)

## 2019-12-16 LAB — TROPONIN I (HIGH SENSITIVITY): Troponin I (High Sensitivity): 4 ng/L (ref <18)

## 2019-12-16 MED ORDER — ASPIRIN 81 MG PO CHEW
324.0000 mg | CHEWABLE_TABLET | Freq: Once | ORAL | Status: AC
  Administered 2019-12-16: 324 mg via ORAL
  Filled 2019-12-16: qty 4

## 2019-12-16 MED ORDER — AMLODIPINE BESYLATE 10 MG PO TABS: 10.0000 mg | ORAL_TABLET | Freq: Every day | ORAL | 0 refills | Status: DC

## 2019-12-16 MED ORDER — AMLODIPINE BESYLATE 5 MG PO TABS
10.0000 mg | ORAL_TABLET | Freq: Once | ORAL | Status: AC
  Administered 2019-12-16: 10 mg via ORAL
  Filled 2019-12-16: qty 2

## 2019-12-16 NOTE — ED Provider Notes (Signed)
[facility] EMERGENCY DEPARTMENT Provider Note   CSN: [id number] Arrival date & time: 12/16/19  0109     History Chief Complaint  Patient presents with  . Chest Pain  . Shoulder Pain    Leslie White is a 64 y.o. female.  HPI  64 year old female presents with left shoulder pain.  Started on 11/17.  Seems to be worsening.  At first it was just in her shoulder but now is more in her arm and in her left axilla.  Hurts with certain range of motion.  Seems to hurt all the time though more with movement.  No shortness of breath.  No exertional symptoms.  At first she was wondering if it was a heart type pain because there was no trauma.  She works in [employer] and it is hard to do her job because of the pain.  She is not sure if she could have injured it at work.  No leg swelling or back pain.  She is noted to be hypertensive here and states she took her meds this morning.  Past Medical History:  Diagnosis Date  . Diabetes mellitus without complication (HCC)   . Hypertension     Patient Active Problem List   Diagnosis Date Noted  . Urinary frequency 06/14/2019  . Snoring 08/19/2016  . 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 Obesity 11/02/2012  . Preventative health care 10/19/2012  . Hypertension   . Diabetes mellitus without complication [facility])     Past Surgical History:  Procedure Laterality Date  . APPENDECTOMY    . CESAREAN SECTION       OB History   No obstetric history on file.     No family history on file.  Social History   Tobacco Use  . Smoking status: Former Smoker    Quit date: 09/08/1987    Years since quitting: 32.2  . Smokeless tobacco: Never Used  Substance Use Topics  . Alcohol use: No    Alcohol/week: 0.0 standard drinks  . Drug use: No    Home Medications Prior to Admission medications   Medication Sig Start Date End Date Taking? Authorizing Provider  amLODipine (NORVASC) 10 MG tablet Take 1 tablet (10 mg total)  by mouth daily. 12/16/19   Pricilla Loveless, MD  empagliflozin (JARDIANCE) 10 MG TABS tablet Take 10 mg by mouth daily before breakfast. 11/16/18   Synetta Fail, MD  Ergocalciferol (VITAMIN D2 PO) Take 1 capsule by mouth See admin instructions. 1.25mg . Take one capsule by mouth every two weeks. No specific day she takes it on.    [provider]  glucose blood test strip Use as instructed 12/05/14   Deneise Lever, MD  hydrochlorothiazide (HYDRODIURIL) 25 MG tablet Take 1 tablet (25 mg total) by mouth daily. 06/14/19   Eliezer Bottom, MD  lisinopril (ZESTRIL) 40 MG tablet Take 1 tablet (40 mg total) by mouth daily. 06/14/19   Eliezer Bottom, MD  metFORMIN (GLUCOPHAGE) 1000 MG tablet Take 1 tablet (1,000 mg total) by mouth 2 (two) times daily with a meal. 06/14/19   Aslam, Leanna Sato, MD  metoprolol tartrate (LOPRESSOR) 100 MG tablet TAKE ONE TABLET BY MOUTH TWICE A DAY 10/06/19   Inez Catalina, MD  nitroGLYCERIN (NITROSTAT) 0.4 MG SL tablet Place 1 tablet (0.4 mg total) under the tongue every 5 (five) minutes as needed for chest pain. Patient not taking: Reported on 12/05/2014 08/29/12   Courtney Paris, MD  [facility] DELICA LANCETS 33G MISC  Use as directed 12/05/14   Deneise Lever, MD  pravastatin (PRAVACHOL) 80 MG tablet Take 1 tablet (80 mg total) by mouth every evening. Patient not taking: Reported on 12/05/2014 12/05/14   Deneise Lever, MD    Allergies    Codeine  Review of Systems   Review of Systems  Respiratory: Negative for shortness of breath.   Cardiovascular: Negative for chest pain and leg swelling.  Musculoskeletal: Positive for arthralgias. Negative for back pain.  All other systems reviewed and are negative.   Physical Exam Updated Vital Signs BP (!) 216/75   Pulse 66   Temp 97.9 F (36.6 C) (Oral)   Resp 18   Ht 5\' 1"  (1.549 m)   Wt 83.9 kg   SpO2 100%   BMI 34.96 kg/m   Physical Exam Vitals and nursing note reviewed.  Constitutional:      General: She is not in  acute distress.    Appearance: She is well-developed. She is not ill-appearing or diaphoretic.  HENT:     Head: Normocephalic and atraumatic.     Right Ear: External ear normal.     Left Ear: External ear normal.     Nose: Nose normal.  Eyes:     General:        Right eye: No discharge.        Left eye: No discharge.  Cardiovascular:     Rate and Rhythm: Normal rate and regular rhythm.     Pulses:          Radial pulses are 2+ on the left side.     Heart sounds: Normal heart sounds.  Pulmonary:     Effort: Pulmonary effort is normal.     Breath sounds: Normal breath sounds.  Abdominal:     Palpations: Abdomen is soft.     Tenderness: There is no abdominal tenderness.  Musculoskeletal:     Left shoulder: No deformity or tenderness.       Arms:     Comments: No focal tenderness in arm/shoulder, but there is some pain with active/passive ROM  Skin:    General: Skin is warm and dry.  Neurological:     Mental Status: She is alert.  Psychiatric:        Mood and Affect: Mood is not anxious.     ED Results / Procedures / Treatments   Labs (all labs ordered are listed, but only abnormal results are displayed) Labs Reviewed  BASIC METABOLIC PANEL - Abnormal; Notable for the following components:      Result Value   Glucose, Bld 134 (*)    All other components within normal limits  CBC WITH DIFFERENTIAL/PLATELET  TROPONIN I (HIGH SENSITIVITY)    EKG EKG Interpretation  Date/Time:  Saturday December 16 2019 07:55:48 EST Ventricular Rate:  73 PR Interval:  184 QRS Duration: 100 QT Interval:  374 QTC Calculation: 412 R Axis:   51 Text Interpretation: Normal sinus rhythm Possible Anterior infarct , age undetermined similar to Aug 2014 Confirmed by Sep 2014 [phone]) on 12/16/2019 9:04:12 AM   Radiology DG Chest 2 View  Result Date: 12/16/2019 CLINICAL DATA:  Chest and left shoulder pain since yesterday. No injury. EXAM: CHEST - 2 VIEW COMPARISON:  08/28/2012  FINDINGS: Lungs are hypoinflated and otherwise clear. Interval development of mild cardiomegaly. Remainder the exam is unchanged. IMPRESSION: No acute findings. Electronically Signed   By: 10/28/2012 M.D.   On: 12/16/2019 10:37   DG Shoulder Left  Result  Date: 12/16/2019 CLINICAL DATA:  Acute LEFT shoulder pain. EXAM: LEFT SHOULDER - 2+ VIEW COMPARISON:  12/16/2019 and 08/28/2012 chest radiographs FINDINGS: No acute fracture, subluxation or dislocation identified. Moderate degenerative changes at the [facility] joint are noted with osteophytic spurring. No other significant abnormalities are present. IMPRESSION: No evidence of acute abnormality. Moderate AC joint degenerative changes. Electronically Signed   By: Harmon Pier M.D.   On: 12/16/2019 10:36    Procedures Procedures (including critical care time)  Medications Ordered in ED Medications  aspirin chewable tablet 324 mg (324 mg Oral Given 12/16/19 0931)  amLODipine (NORVASC) tablet 10 mg (10 mg Oral Given 12/16/19 1141)    ED Course  I have reviewed the triage vital signs and the nursing notes.  Pertinent labs & imaging results that were available during my care of the patient were reviewed by me and considered in my medical decision making (see chart for details).    MDM Rules/Calculators/A&P                          Given patient is having continuous symptoms for days, I think 1 troponin is sufficient to rule out MI.  This pain seems to be more of a shoulder issue but because of her hypertension labs were evaluated.  Otherwise these are benign.  I have also personally reviewed the x-rays which are benign.  Probably this is related to the arthritis seen on shoulder x-ray.  However she is noted to be quite hypertensive despite taking her meds this morning and being compliant at home per her report.  Thus I have consulted the internal medicine teaching service to help with medication adjustment.  Discussed with Dr. Mcarthur Rossetti, who has reviewed  her chart and knows patient.  She recommends 10 mg Norvasc.  We will follow up in the clinic this week.  She will be given a dose now and a prescription.  We discussed return precautions. Final Clinical Impression(s) / ED Diagnoses Final diagnoses:  Acute pain of left shoulder  Hypertensive urgency    Rx / DC Orders ED Discharge Orders         Ordered    amLODipine (NORVASC) 10 MG tablet  Daily        12/16/19 1124           Pricilla Loveless, MD 12/16/19 1449

## 2019-12-16 NOTE — ED Notes (Signed)
Pt discharge instructions and prescriptions reviewed with the patient. The patient verbalized understanding. Pt discharged. 

## 2019-12-16 NOTE — ED Triage Notes (Signed)
C/o left shoulder pain onset several days ago no injury that she is aware, worse with movement.

## 2019-12-16 NOTE — Discharge Instructions (Addendum)
If you develop new or worsening headache, chest pain, shortness of breath, or any other new/concerning symptoms then return to the ER for evaluation.  You were given your first dose of Norvasc/amlodipine today.  Take the next dose tomorrow.

## 2020-01-08 ENCOUNTER — Other Ambulatory Visit: Payer: Self-pay | Admitting: Internal Medicine

## 2020-01-08 DIAGNOSIS — E119 Type 2 diabetes mellitus without complications: Secondary | ICD-10-CM

## 2020-01-09 NOTE — Telephone Encounter (Signed)
I have never seen this patient. Please have her schedule follow up with me next available.

## 2020-01-16 ENCOUNTER — Encounter: Payer: Self-pay | Admitting: Internal Medicine

## 2020-03-12 ENCOUNTER — Other Ambulatory Visit: Payer: Self-pay | Admitting: Internal Medicine

## 2020-03-12 DIAGNOSIS — I10 Essential (primary) hypertension: Secondary | ICD-10-CM

## 2020-03-12 NOTE — Telephone Encounter (Signed)
Called pt to schedule an appt - no answer; mailbox full, unable to leave a message.

## 2020-03-12 NOTE — Telephone Encounter (Signed)
Refill request for HCTZ denied. She was last seen in May of 2021 with plans for 4 week follow up. Already given short term refill then no showed her appointment. If she calls back to schedule, let me know and I will send a short term supply to last her until then. Otherwise we can't continue to fill without follow up.

## 2020-03-14 ENCOUNTER — Telehealth: Payer: Self-pay

## 2020-03-14 NOTE — Telephone Encounter (Signed)
Returned call to patient. No answer. [facility] is full and cannot accept any messages at this time. Kinnie Feil, BSN, RN-BC

## 2020-03-14 NOTE — Telephone Encounter (Signed)
Requesting to speak with a nurse about meds. Pt states she cannot come in due to insurance problem. Please call pt back.

## 2020-03-24 ENCOUNTER — Other Ambulatory Visit: Payer: Self-pay | Admitting: Internal Medicine

## 2020-03-24 DIAGNOSIS — I10 Essential (primary) hypertension: Secondary | ICD-10-CM

## 2020-04-13 ENCOUNTER — Other Ambulatory Visit: Payer: Self-pay | Admitting: Internal Medicine

## 2020-04-13 DIAGNOSIS — I10 Essential (primary) hypertension: Secondary | ICD-10-CM

## 2020-04-16 ENCOUNTER — Telehealth: Payer: Self-pay

## 2020-05-02 ENCOUNTER — Other Ambulatory Visit: Payer: Self-pay | Admitting: Internal Medicine

## 2020-05-02 DIAGNOSIS — I10 Essential (primary) hypertension: Secondary | ICD-10-CM

## 2020-05-07 ENCOUNTER — Encounter: Payer: Self-pay | Admitting: Internal Medicine

## 2020-05-07 NOTE — Telephone Encounter (Signed)
This patient has been called. N/a. A letter has been mailed to the patient.

## 2020-05-09 ENCOUNTER — Other Ambulatory Visit: Payer: Self-pay | Admitting: Internal Medicine

## 2020-05-09 DIAGNOSIS — I10 Essential (primary) hypertension: Secondary | ICD-10-CM

## 2020-06-17 ENCOUNTER — Telehealth: Payer: Self-pay

## 2020-06-17 DIAGNOSIS — E119 Type 2 diabetes mellitus without complications: Secondary | ICD-10-CM

## 2020-06-17 DIAGNOSIS — I10 Essential (primary) hypertension: Secondary | ICD-10-CM

## 2020-06-17 NOTE — Telephone Encounter (Signed)
Need refill all medicine; [phone]    [id number] [place], [place] - [address]

## 2020-06-17 NOTE — Telephone Encounter (Signed)
Pt has an appt scheduled 6/15 with Dr Antony Contras. Will pt be able to  get refills until her appt or not?

## 2020-06-19 MED ORDER — LISINOPRIL 40 MG PO TABS: 40.0000 mg | ORAL_TABLET | Freq: Every day | ORAL | 0 refills | Status: DC

## 2020-06-19 MED ORDER — METOPROLOL TARTRATE 100 MG PO TABS: 100.0000 mg | ORAL_TABLET | Freq: Two times a day (BID) | ORAL | 0 refills | Status: DC

## 2020-06-19 MED ORDER — METFORMIN HCL 1000 MG PO TABS: 1000.0000 mg | ORAL_TABLET | Freq: Two times a day (BID) | ORAL | 0 refills | Status: DC

## 2020-06-19 MED ORDER — PRAVASTATIN SODIUM 80 MG PO TABS
80.0000 mg | ORAL_TABLET | Freq: Every evening | ORAL | 0 refills | Status: DC
Start: 2020-06-19 — End: 2021-03-17

## 2020-06-19 MED ORDER — EMPAGLIFLOZIN 10 MG PO TABS: 10.0000 mg | ORAL_TABLET | Freq: Every day | ORAL | 0 refills | Status: DC

## 2020-06-19 MED ORDER — HYDROCHLOROTHIAZIDE 25 MG PO TABS: 25.0000 mg | ORAL_TABLET | Freq: Every day | ORAL | 0 refills | Status: DC

## 2020-06-19 MED ORDER — AMLODIPINE BESYLATE 10 MG PO TABS
10.0000 mg | ORAL_TABLET | Freq: Every day | ORAL | 0 refills | Status: DC
Start: 2020-06-19 — End: 2020-07-15

## 2020-06-19 NOTE — Telephone Encounter (Signed)
I sent 1 month [facility] [place]. Needs to keep her appointment for further refills. Thanks.

## 2020-06-19 NOTE — Telephone Encounter (Signed)
FO staff relayed info below to patient.

## 2020-07-02 ENCOUNTER — Encounter: Payer: Self-pay | Admitting: Dietician

## 2020-07-02 ENCOUNTER — Telehealth: Payer: Self-pay | Admitting: Dietician

## 2020-07-02 NOTE — Telephone Encounter (Signed)
Tried calling this patient. Their voicemail box is full. I was unable to leave a message  

## 2020-07-10 ENCOUNTER — Encounter: Payer: BC Managed Care – PPO | Admitting: Internal Medicine

## 2020-07-13 ENCOUNTER — Other Ambulatory Visit: Payer: Self-pay | Admitting: Internal Medicine

## 2020-07-13 DIAGNOSIS — I10 Essential (primary) hypertension: Secondary | ICD-10-CM

## 2020-07-14 ENCOUNTER — Encounter: Payer: Self-pay | Admitting: *Deleted

## 2020-07-14 NOTE — Progress Notes (Unsigned)

## 2020-07-15 ENCOUNTER — Encounter: Payer: Self-pay | Admitting: Internal Medicine

## 2020-07-15 ENCOUNTER — Ambulatory Visit (INDEPENDENT_AMBULATORY_CARE_PROVIDER_SITE_OTHER): Payer: Medicare HMO | Admitting: Internal Medicine

## 2020-07-15 VITALS — BP 186/71 | HR 75 | Temp 98.1°F | Ht 61.5 in | Wt 187.5 lb

## 2020-07-15 DIAGNOSIS — Z0000 Encounter for general adult medical examination without abnormal findings: Secondary | ICD-10-CM | POA: Diagnosis not present

## 2020-07-15 DIAGNOSIS — E119 Type 2 diabetes mellitus without complications: Secondary | ICD-10-CM | POA: Diagnosis not present

## 2020-07-15 DIAGNOSIS — I10 Essential (primary) hypertension: Secondary | ICD-10-CM | POA: Diagnosis not present

## 2020-07-15 LAB — POCT GLYCOSYLATED HEMOGLOBIN (HGB A1C): Hemoglobin A1C: 8 % — AB (ref 4.0–5.6)

## 2020-07-15 LAB — GLUCOSE, CAPILLARY: Glucose-Capillary: 202 mg/dL — ABNORMAL HIGH (ref 70–99)

## 2020-07-15 MED ORDER — LISINOPRIL 40 MG PO TABS: 40.0000 mg | ORAL_TABLET | Freq: Every day | ORAL | 0 refills | Status: DC

## 2020-07-15 MED ORDER — AMLODIPINE BESYLATE 10 MG PO TABS: 10.0000 mg | ORAL_TABLET | Freq: Every day | ORAL | 0 refills | Status: DC

## 2020-07-15 MED ORDER — HYDROCHLOROTHIAZIDE 25 MG PO TABS: 25.0000 mg | ORAL_TABLET | Freq: Every day | ORAL | 0 refills | Status: DC

## 2020-07-15 MED ORDER — METFORMIN HCL 1000 MG PO TABS: 1000.0000 mg | ORAL_TABLET | Freq: Two times a day (BID) | ORAL | 0 refills | Status: DC

## 2020-07-15 MED ORDER — METOPROLOL TARTRATE 100 MG PO TABS: 100.0000 mg | ORAL_TABLET | Freq: Two times a day (BID) | ORAL | 0 refills | Status: DC

## 2020-07-15 NOTE — Assessment & Plan Note (Signed)
Patient no longer taking statin, when asked if there were side effects which led to halting statin therapy she states that the medications that she read are the only ones she is taking. She endorses working out at [employer] 5 times a week. - Order Lipid Panel  Has not undergone colonoscopy, does have a bottle for iFOBT, but has not filled it. She is unsure of the time line. - Will reorder iFOBT  Has taken two of the COVID-19 vaccinations (Moderna)  Had her Last tetanus vaccine a year and a half ago.   Due for DEXA scan. - Ordered DEXA scan.   Is not considering taking the Shingles vaccine. - Will readdress at subsequent visits.

## 2020-07-15 NOTE — Assessment & Plan Note (Signed)
Vitals with BMI 07/15/2020 07/15/2020 12/16/2019  Height - 5' 1.5" -  Weight - 187 lbs 8 oz -  BMI - 34.86 -  Systolic 186 199 542  Diastolic 71 86 75  Pulse 75 78 66   Hypertension is uncontrolled, slightly improved from November reading. Currently on HCTZ 25 mg daily, lisinopril 40 mg daily, Metoprolol 100 mg BID, Amlodipine 10 mg daily. She states she is compliant with her medications. Recently had prescriptions filled in 05/2020, but had been off of them for "a time" before getting them refilled. She states she has several days left.   She is also making lifestyle changes. She is working out at [employer] for the past 2 months and goes 5 times per week. She would like to continue with lifestyle changes.   She denies headaches, blurry vision, vertigo, chest pain, or abdominal pain.  A/P:  Patient with uncontrolled, asymptomatic HTN. She is on 3 agents. Discussed stroke risk given HTN, HLD, DM but patient would like to continue with lifestyle changes. Discussed refilling medications, and having a BP check in 2 weeks to reassess. She is agreeable.  - Continue current regimen - Continue lifestyle changes - F/U 2 weeks for BP check - BMP today

## 2020-07-15 NOTE — Assessment & Plan Note (Addendum)
Last A1c was 8.4 with repeat 8.0 today. She is currently taking Metformin 1000 mg BID. As noted in previous documentation, patient was placed Jardiance, but has not taken the medication. She has started going to the [facility] for the past two months and does aquatic exercise, zumba, and aerobics. She is working on lifestyle changes, and would like to continue. Her last A1c was a year ago. Pulses are full today.   Discussed ophthalmologic appointment for retinopathy. Patient stated that it had been a long time since she saw an eye doctor and that "As long as my bifocals work, I don't think I will go." Will still place an optho referral. Will continue to readdress at next visit.  - A1c  - Continue Metformin 1000 mg twice daily - Continue Lifestyle changes.  - F/U A1c in 3 months - Will DC Jardiance as patient is not taking - Referral for Opthalmology

## 2020-07-15 NOTE — Patient Instructions (Addendum)
To Ms. Vallarie,   It was a pleasure meeting you today! Today we discussed your diabetes, blood pressure, and preventative health measures.   For your diabetes, please continue taking your metformin and working out at [employer], we will check your A1c today to see how your sugars are doing.   For your blood pressure, we will refill your medications today and will have you follow back with [place] in 2 weeks to see how your pressures are doing.   For your Preventative health measures, we will order the stool sample for your colon cancer screen, and check your baseline labs. We will additional get a bone mineral exam to see if you have strong bones. Continue working out at [employer], as working out keeps bones healthy.   We will see you in two weeks.  Dolan Amen, MD

## 2020-07-15 NOTE — Progress Notes (Signed)
   CC: Check Up  HPI:  [name] is a 65 y.o. person, with a PMH noted below, who presents to the clinic for a check up. To see the management of their acute and chronic conditions, please see the A&P note under the Encounters tab.   Past Medical History:  Diagnosis Date   Diabetes mellitus without complication (HCC)    Hypertension    Review of Systems:   Review of Systems  Constitutional:  Negative for chills, fever, malaise/fatigue and weight loss.  Cardiovascular:  Negative for chest pain and palpitations.  Gastrointestinal:  Negative for abdominal pain, constipation, diarrhea, nausea and vomiting.  Neurological:  Negative for dizziness, tingling, sensory change, speech change, weakness and headaches.    Physical Exam:  Vitals:   07/15/20 0859 07/15/20 0918  BP: (!) 199/86 (!) 186/71  Pulse: 78 75  Temp: 98.1 F (36.7 C)   TempSrc: Oral   SpO2: 99%   Weight: 187 lb 8 oz (85 kg)   Height: 5' 1.5" (1.562 m)    Physical Exam Constitutional:      General: She is not in acute distress.    Appearance: Normal appearance.  Cardiovascular:     Rate and Rhythm: Normal rate and regular rhythm.     Pulses: Normal pulses.     Heart sounds: Normal heart sounds. No murmur heard.   No friction rub. No gallop.     Comments: Dorsalis pedis pulse 4+ bilaterally Pulmonary:     Effort: Pulmonary effort is normal.     Breath sounds: Normal breath sounds. No wheezing, rhonchi or rales.  Neurological:     Mental Status: She is alert and oriented to person, place, and time.  Psychiatric:        Mood and Affect: Mood normal.        Behavior: Behavior normal.     Assessment & Plan:   See Encounters Tab for problem based charting.  Patient discussed with Dr. Mayford Knife

## 2020-07-16 LAB — BMP8+ANION GAP
Anion Gap: 17 mmol/L (ref 10.0–18.0)
BUN/Creatinine Ratio: 15 (ref 12–28)
BUN: 14 mg/dL (ref 8–27)
CO2: 24 mmol/L (ref 20–29)
Calcium: 10.2 mg/dL (ref 8.7–10.3)
Chloride: 98 mmol/L (ref 96–106)
Creatinine, Ser: 0.93 mg/dL (ref 0.57–1.00)
Glucose: 174 mg/dL — ABNORMAL HIGH (ref 65–99)
Potassium: 4.2 mmol/L (ref 3.5–5.2)
Sodium: 139 mmol/L (ref 134–144)
eGFR: 68 mL/min/{1.73_m2} (ref >59)

## 2020-07-16 LAB — LIPID PANEL
Chol/HDL Ratio: 4.2 ratio (ref 0.0–4.4)
Cholesterol, Total: 212 mg/dL — ABNORMAL HIGH (ref 100–199)
HDL: 51 mg/dL (ref >39)
LDL Chol Calc (NIH): 143 mg/dL — ABNORMAL HIGH (ref 0–99)
Triglycerides: 103 mg/dL (ref 0–149)
VLDL Cholesterol Cal: 18 mg/dL (ref 5–40)

## 2020-07-31 ENCOUNTER — Other Ambulatory Visit: Payer: Self-pay | Admitting: Student

## 2020-07-31 NOTE — Progress Notes (Signed)
Things That May Be Affecting Your Health:  Alcohol  Hearing loss  Pain    Depression  Home Safety  Sexual Health  X Diabetes  Lack of physical activity  Stress   Difficulty with daily activities  Loneliness  Tiredness   Drug use  Medicines  Tobacco use   Falls  Motor Vehicle Safety X Weight  X Food choices  Oral Health  Other    YOUR PERSONALIZED HEALTH PLAN : 1. Schedule your next subsequent Medicare Wellness visit in one year 2. Attend all of your regular appointments to address your medical issues 3. Complete the preventative screenings and services   Annual Wellness Visit   Medicare Covered Preventative Screenings and Services  Services & Screenings Men and Women Who How Often Need? Date of Last Service Action  Abdominal Aortic Aneurysm Adults with AAA risk factors Once      Alcohol Misuse and Counseling All Adults Screening once a year if no alcohol misuse. Counseling up to 4 face to face sessions.     Bone Density Measurement  Adults at risk for osteoporosis Once every 2 yrs      Lipid Panel Z13.6 All adults without CV disease Once every 5 yrs       Colorectal Cancer  Stool sample or Colonoscopy All adults 50 and older  Once every year Every 10 years Yes       Depression All Adults Once a year  Today   Diabetes Screening Blood glucose, post glucose load, or GTT Z13.1 All adults at risk Pre-diabetics Once per year Twice per year      Diabetes  Self-Management Training All adults Diabetics 10 hrs first year; 2 hours subsequent years. Requires Copay Yes    Glaucoma Diabetics Family history of glaucoma African Americans 50 yrs + Hispanic Americans 65 yrs + Annually - requires coppay      Hepatitis C Z72.89 or F19.20 High Risk for HCV Born between 1945 and 1965 Annually Once      HIV Z11.4 All adults based on risk Annually btw ages 90 & 63 regardless of risk Annually > 65 yrs if at increased risk      Lung Cancer Screening Asymptomatic adults aged 16-77 with  30 pack yr history and current smoker OR quit within the last 15 yrs Annually Must have counseling and shared decision making documentation before first screen      Medical Nutrition Therapy Adults with  Diabetes Renal disease Kidney transplant within past 3 yrs 3 hours first year; 2 hours subsequent years     Obesity and Counseling All adults Screening once a year Counseling if BMI 30 or higher  Today   Tobacco Use Counseling Adults who use tobacco  Up to 8 visits in one year     Vaccines Z23 Hepatitis B Influenza  Pneumonia  Adults  Once Once every flu season Two different vaccines separated by one year Yes    Next Annual Wellness Visit People with Medicare Every year  Today     Services & Screenings Women Who How Often Need  Date of Last Service Action  Mammogram  Z12.31 Women over 40 One baseline ages 80-39. Annually ager 40 yrs+      Pap tests All women Annually if high risk. Every 2 yrs for normal risk women Yes     Screening for cervical cancer with  Pap (Z01.419 nl or Z01.411abnl) & HPV Z11.51 Women aged 84 to 67 Once every 5 yrs  Screening pelvic and breast exams All women Annually if high risk. Every 2 yrs for normal risk women     Sexually Transmitted Diseases Chlamydia Gonorrhea Syphilis All at risk adults Annually for non pregnant females at increased risk         Services & Screenings Men Who How Ofter Need  Date of Last Service Action  Prostate Cancer - DRE & PSA Men over 50 Annually.  DRE might require a copay.        Sexually Transmitted Diseases Syphilis All at risk adults Annually for men at increased risk      Health Maintenance List Health Maintenance  Topic Date Due   TETANUS/TDAP  Never done   COLON CANCER SCREENING ANNUAL FOBT  Never done   COLONOSCOPY (Pts 45-53yrs Insurance coverage will need to be confirmed)  Never done   Zoster Vaccines- Shingrix (1 of 2) Never done   OPHTHALMOLOGY EXAM  10/13/2013   PAP SMEAR-Modifier   11/03/2015   DEXA SCAN  Never done   PNA vac Low Risk Adult (1 of 2 - PCV13) Never done   COVID-19 Vaccine (3 - Booster for Moderna series) 04/14/2020   INFLUENZA VACCINE  08/26/2020   HEMOGLOBIN A1C  10/15/2020   MAMMOGRAM  12/13/2020   FOOT EXAM  07/15/2021   Hepatitis C Screening  Completed   HIV Screening  Completed   HPV VACCINES  Aged Out

## 2020-08-06 ENCOUNTER — Other Ambulatory Visit: Payer: Self-pay | Admitting: Internal Medicine

## 2020-08-06 DIAGNOSIS — I10 Essential (primary) hypertension: Secondary | ICD-10-CM

## 2020-08-14 ENCOUNTER — Other Ambulatory Visit: Payer: Self-pay | Admitting: Internal Medicine

## 2020-08-14 DIAGNOSIS — I10 Essential (primary) hypertension: Secondary | ICD-10-CM

## 2020-08-31 ENCOUNTER — Other Ambulatory Visit: Payer: Self-pay | Admitting: Student

## 2020-08-31 DIAGNOSIS — I10 Essential (primary) hypertension: Secondary | ICD-10-CM

## 2020-09-10 ENCOUNTER — Other Ambulatory Visit: Payer: Self-pay | Admitting: Internal Medicine

## 2020-09-10 DIAGNOSIS — I10 Essential (primary) hypertension: Secondary | ICD-10-CM

## 2020-09-17 ENCOUNTER — Other Ambulatory Visit: Payer: Self-pay | Admitting: Internal Medicine

## 2020-09-17 DIAGNOSIS — I10 Essential (primary) hypertension: Secondary | ICD-10-CM

## 2020-09-25 ENCOUNTER — Other Ambulatory Visit: Payer: Self-pay | Admitting: Internal Medicine

## 2020-09-25 DIAGNOSIS — I10 Essential (primary) hypertension: Secondary | ICD-10-CM

## 2020-10-04 ENCOUNTER — Encounter: Payer: Medicare HMO | Admitting: Internal Medicine

## 2020-11-25 ENCOUNTER — Other Ambulatory Visit: Payer: Self-pay | Admitting: Internal Medicine

## 2020-11-25 DIAGNOSIS — I10 Essential (primary) hypertension: Secondary | ICD-10-CM

## 2020-12-11 IMAGING — CR DG CHEST 2V
2 series · 2 of 2 positions shown · non-contrast
Comparison: 08/28/2012

CLINICAL DATA: Chest and left shoulder pain since yesterday. No
injury.

EXAM:
CHEST - 2 VIEW

[chest lat]
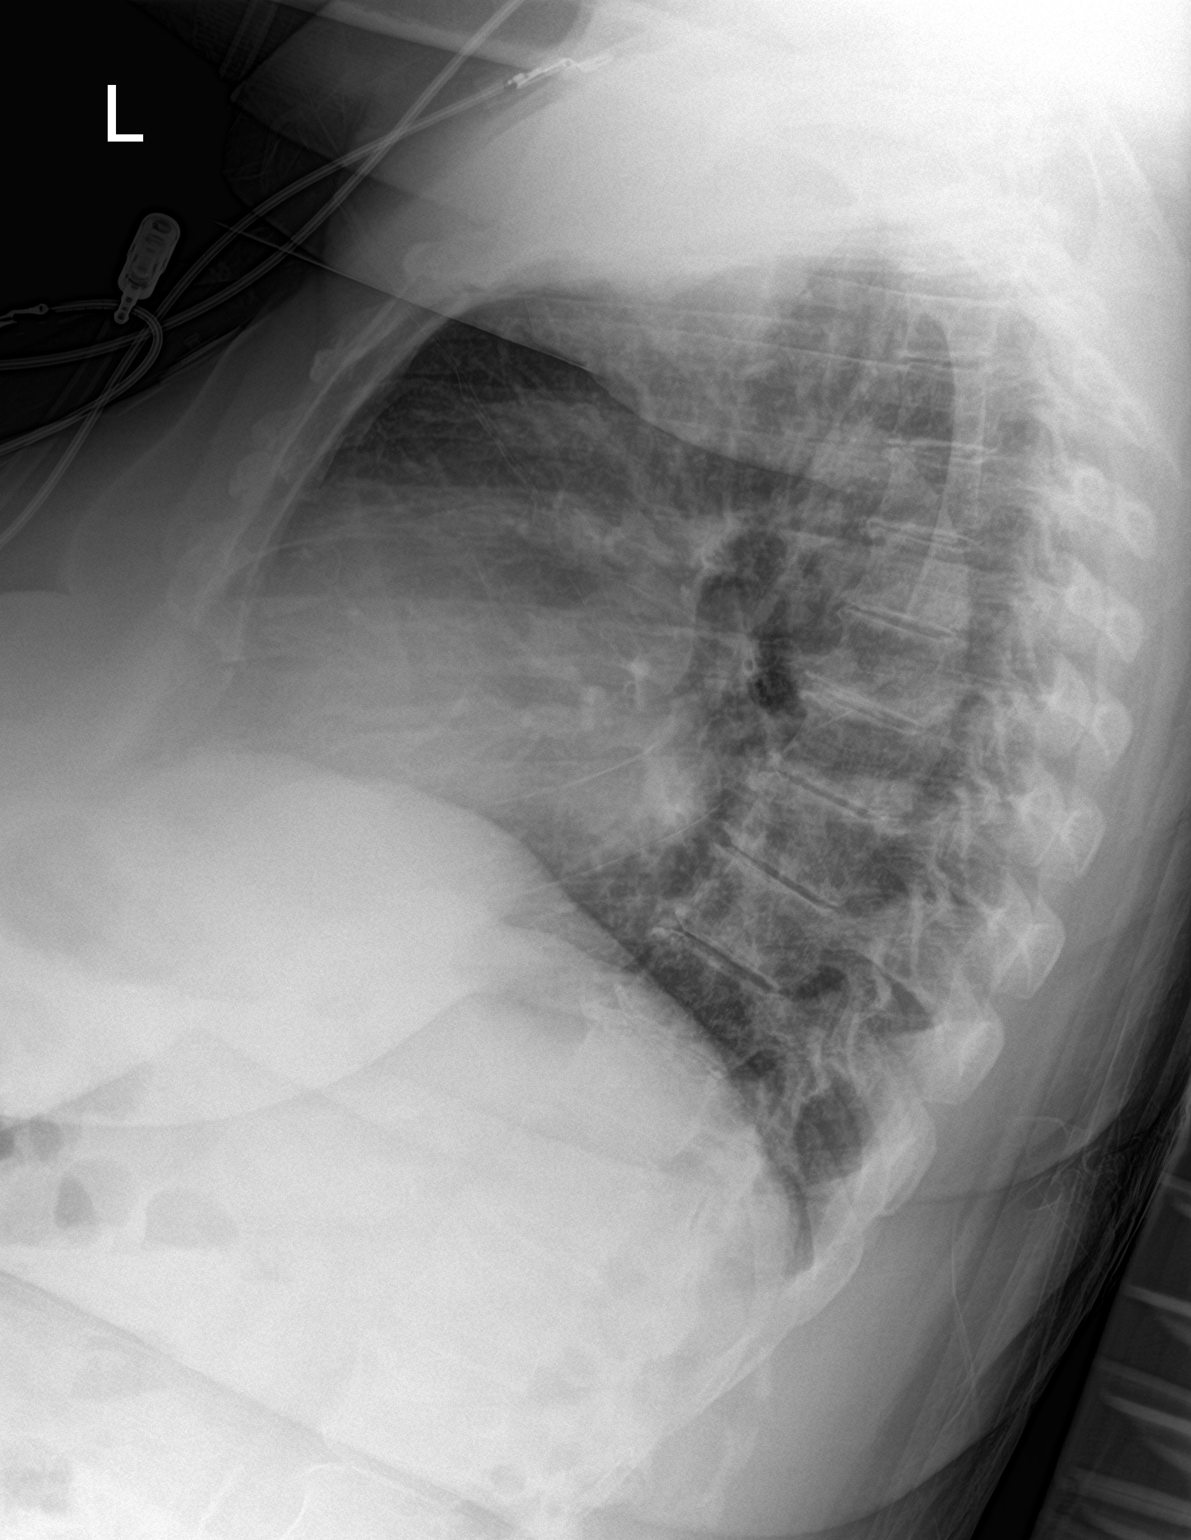

[chest ap]
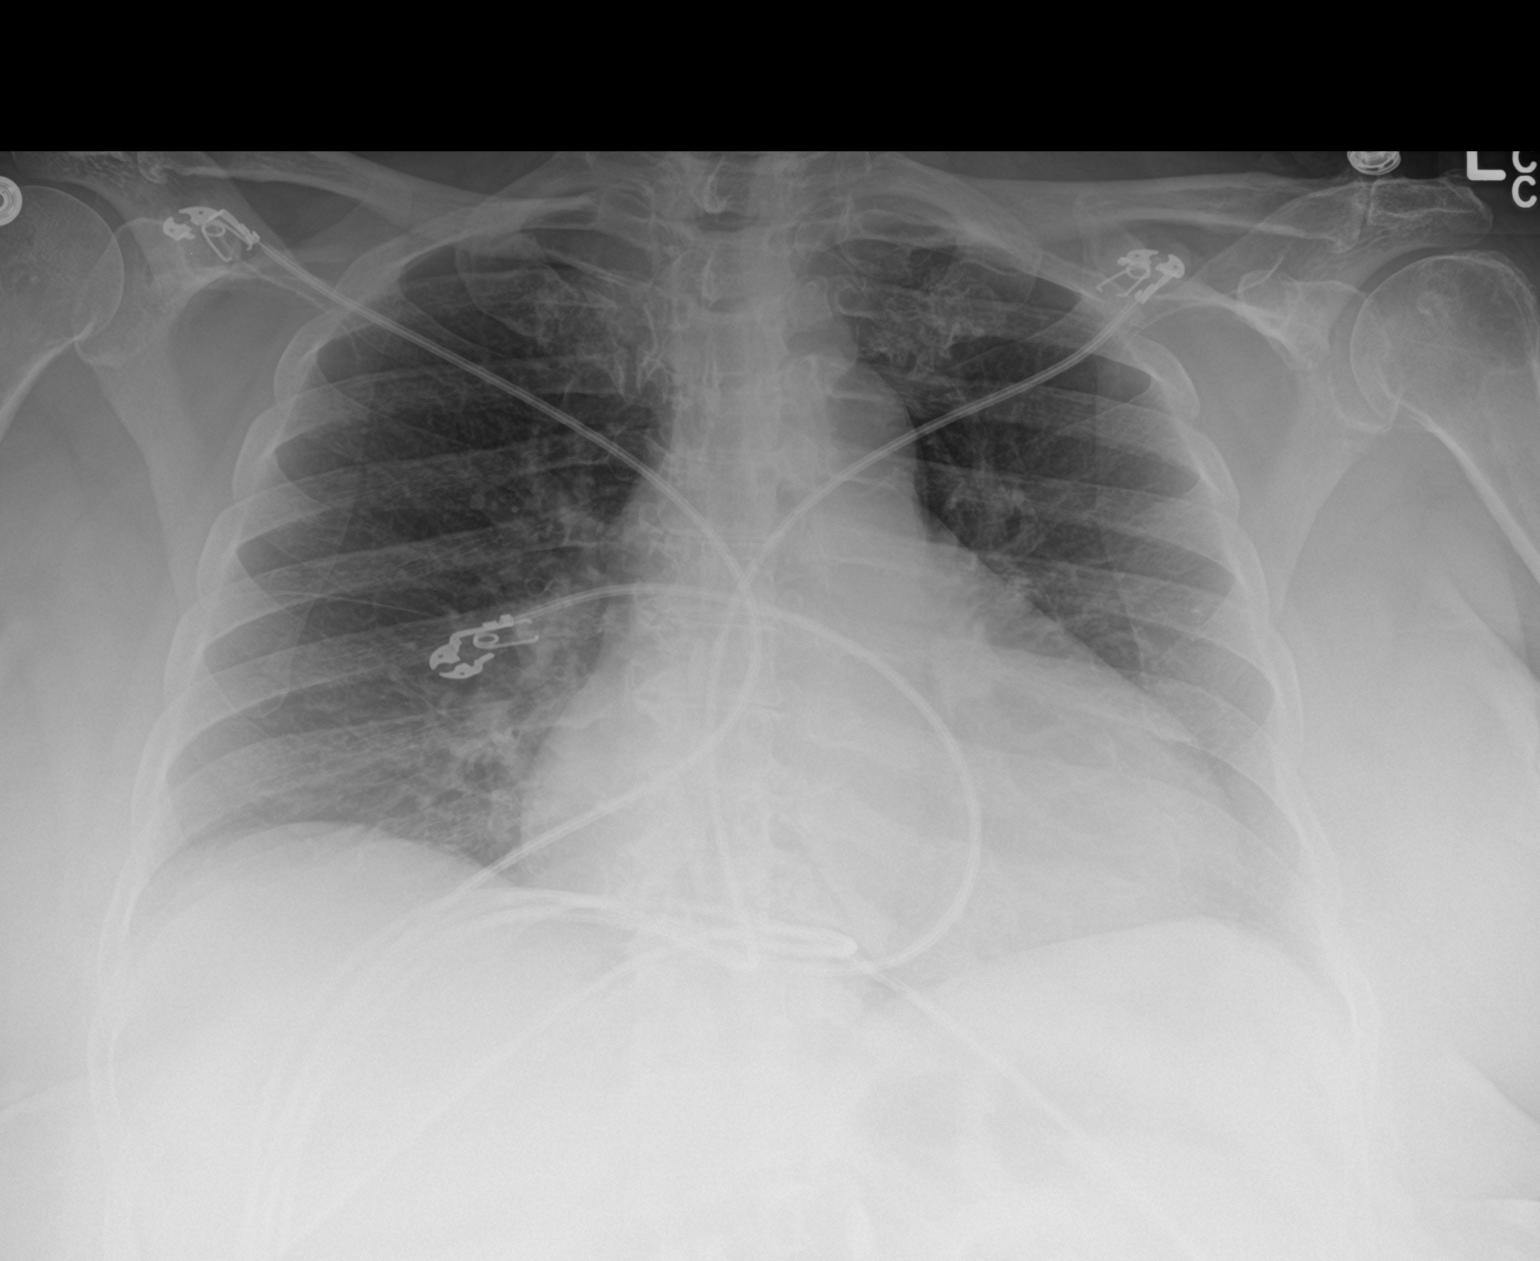

[2 of 2 positions shown; findings below may reference images not displayed]

FINDINGS: Lungs are hypoinflated and otherwise clear. Interval development of
mild cardiomegaly. Remainder the exam is unchanged.
IMPRESSION: No acute findings.

## 2020-12-21 ENCOUNTER — Other Ambulatory Visit: Payer: Self-pay | Admitting: Internal Medicine

## 2020-12-21 DIAGNOSIS — I10 Essential (primary) hypertension: Secondary | ICD-10-CM

## 2021-01-16 ENCOUNTER — Other Ambulatory Visit: Payer: Self-pay | Admitting: Internal Medicine

## 2021-01-16 DIAGNOSIS — I10 Essential (primary) hypertension: Secondary | ICD-10-CM

## 2021-01-22 NOTE — Telephone Encounter (Signed)
Needs appointment in person please for BP follow up.

## 2021-02-24 ENCOUNTER — Encounter: Payer: Medicare HMO | Admitting: Internal Medicine

## 2021-02-28 ENCOUNTER — Other Ambulatory Visit: Payer: Self-pay | Admitting: Internal Medicine

## 2021-02-28 DIAGNOSIS — E119 Type 2 diabetes mellitus without complications: Secondary | ICD-10-CM

## 2021-03-17 ENCOUNTER — Ambulatory Visit (INDEPENDENT_AMBULATORY_CARE_PROVIDER_SITE_OTHER): Payer: Medicare HMO | Admitting: Student

## 2021-03-17 ENCOUNTER — Other Ambulatory Visit: Payer: Self-pay

## 2021-03-17 ENCOUNTER — Encounter: Payer: Self-pay | Admitting: Student

## 2021-03-17 VITALS — BP 149/75 | HR 73 | Temp 98.3°F | Ht 61.5 in | Wt 184.0 lb

## 2021-03-17 DIAGNOSIS — E782 Mixed hyperlipidemia: Secondary | ICD-10-CM | POA: Diagnosis not present

## 2021-03-17 DIAGNOSIS — Z0000 Encounter for general adult medical examination without abnormal findings: Secondary | ICD-10-CM

## 2021-03-17 DIAGNOSIS — E669 Obesity, unspecified: Secondary | ICD-10-CM | POA: Diagnosis not present

## 2021-03-17 DIAGNOSIS — E119 Type 2 diabetes mellitus without complications: Secondary | ICD-10-CM | POA: Diagnosis not present

## 2021-03-17 DIAGNOSIS — I10 Essential (primary) hypertension: Secondary | ICD-10-CM

## 2021-03-17 LAB — POCT GLYCOSYLATED HEMOGLOBIN (HGB A1C): Hemoglobin A1C: 7.9 % — AB (ref 4.0–5.6)

## 2021-03-17 LAB — GLUCOSE, CAPILLARY: Glucose-Capillary: 163 mg/dL — ABNORMAL HIGH (ref 70–99)

## 2021-03-17 MED ORDER — ROSUVASTATIN CALCIUM 20 MG PO TABS: 20.0000 mg | ORAL_TABLET | Freq: Every day | ORAL | 1 refills | Status: DC

## 2021-03-17 MED ORDER — AMLODIPINE BESYLATE 10 MG PO TABS: 10.0000 mg | ORAL_TABLET | Freq: Every day | ORAL | 3 refills | Status: DC

## 2021-03-17 NOTE — Progress Notes (Signed)
° °  CC: Follow-up  HPI:  [name] is a 66 y.o. female with PMH as below who presents to clinic to follow-up on her chronic medical problems. Please see problem based charting for evaluation, assessment and plan.  Past Medical History:  Diagnosis Date   Diabetes mellitus without complication (HCC)    Hypertension     Review of Systems:  Constitutional: Negative for fever or fatigue Eyes: Negative for visual changes Respiratory: Negative for shortness of breath MSK: Negative for back pain Neuro: Negative for headache, dizziness or weakness  Physical Exam: General: Pleasant, well-appearing elderly female. No acute distress. Cardiac: RRR. No murmurs, rubs or gallops. No LE edema Respiratory: Lungs CTAB. No wheezing or crackles. Abdominal: Soft, symmetric and non tender. Normal BS. Skin: Warm, dry and intact without rashes or lesions Extremities: Atraumatic. Full ROM. 2+ radial and DP pulses. Neuro: A&O x 3. Moves all extremities.  Psych: Appropriate mood and affect.  Vitals:   03/17/21 1019 03/17/21 1126  BP: (!) 157/68 (!) 149/75  Pulse: 72 73  Temp: 98.3 F (36.8 C)   TempSrc: Oral   SpO2: 100%   Weight: 184 lb (83.5 kg)   Height: 5' 1.5" (1.562 m)     Assessment & Plan:   See Encounters Tab for problem based charting.  Patient discussed with Dr. West Bali, MD, MPH

## 2021-03-17 NOTE — Patient Instructions (Signed)
Thank you, Ms.Delainie Thompson-Price for allowing [place] to provide your care today. Today we discussed your diabetes, blood pressure and cholestenol. Continue exercising and decreasing your sugar intake to help improve your diabetes.   I have ordered the following labs for you:   Lab Orders         Fecal occult blood, imunochemical         Glucose, capillary         CMP14 + Anion Gap         POC Hbg A1C      I will call if any are abnormal. All of your labs can be accessed through "My Chart".   I have ordered the following tests: FOBT Test  I have ordered the following medication/changed the following medications:  Start Amlodipine 10 mg daily Start Crestor 20 mg daily  My Chart Access: [email or web address]  Please follow-up in 3 months  Please make sure to arrive 15 minutes prior to your next appointment. If you arrive late, you may be asked to reschedule.    We look forward to seeing you next time. Please call our clinic at [phone] if you have any questions or concerns. The best time to call is Monday-Friday from 9am-4pm, but there is someone available 24/7. If after hours or the weekend, call the main hospital number and ask for the Internal Medicine Resident On-Call. If you need medication refills, please notify your pharmacy one week in advance and they will send [place] a request.   Thank you for letting us take part in your care. Wishing you the best!  Lacinda Axon, MD 03/17/2021, 11:21 AM IM Resident, PGY-2 [facility] Milian 41:10

## 2021-03-18 ENCOUNTER — Encounter: Payer: Self-pay | Admitting: Student

## 2021-03-18 DIAGNOSIS — E785 Hyperlipidemia, unspecified: Secondary | ICD-10-CM | POA: Insufficient documentation

## 2021-03-18 LAB — CMP14 + ANION GAP
ALT: 17 IU/L (ref 0–32)
AST: 20 IU/L (ref 0–40)
Albumin/Globulin Ratio: 1.6 (ref 1.2–2.2)
Albumin: 4.4 g/dL (ref 3.8–4.8)
Alkaline Phosphatase: 70 IU/L (ref 44–121)
Anion Gap: 16 mmol/L (ref 10.0–18.0)
BUN/Creatinine Ratio: 19 (ref 12–28)
BUN: 23 mg/dL (ref 8–27)
Bilirubin Total: 0.3 mg/dL (ref 0.0–1.2)
CO2: 24 mmol/L (ref 20–29)
Calcium: 9.8 mg/dL (ref 8.7–10.3)
Chloride: 98 mmol/L (ref 96–106)
Creatinine, Ser: 1.24 mg/dL — ABNORMAL HIGH (ref 0.57–1.00)
Globulin, Total: 2.7 g/dL (ref 1.5–4.5)
Glucose: 121 mg/dL — ABNORMAL HIGH (ref 70–99)
Potassium: 4 mmol/L (ref 3.5–5.2)
Sodium: 138 mmol/L (ref 134–144)
Total Protein: 7.1 g/dL (ref 6.0–8.5)
eGFR: 48 mL/min/{1.73_m2} — ABNORMAL LOW (ref >59)

## 2021-03-18 NOTE — Assessment & Plan Note (Signed)
BMI 34.2. Patient reports that she has always been [place]. She continues to workout at the [facility] every week and would like to continue lifestyle modifications. Patient not interested in any medications to help with weight loss at this point and would like to work on diet and exercise.

## 2021-03-18 NOTE — Assessment & Plan Note (Addendum)
Patient here to follow-up on her blood pressure 8 months after her last office visit. Patient supposed to be on 4 antihypertensive agents but reports not taking amlodipine 10 mg daily. She has no acute complaints and denies any headaches, blurry vision or dizziness. She reports going to the [facility] multiple times a week.  BP still elevated to SBP in the 140s to 150s.  Patient agreeable to going back on amlodipine, checking her BP at home and following up in about 2 weeks.  CMP today shows mild AKI but no electrolyte abnormalities. Vitals:   03/17/21 1019 03/17/21 1126  BP: (!) 157/68 (!) 149/75    Plan: -- Start amlodipine 10 mg daily -- Continue HCTZ 25 mg daily, lisinopril 40 mg daily and metoprolol 100 mg twice daily -- Follow-up in 2 weeks for repeat BP and BMP

## 2021-03-18 NOTE — Assessment & Plan Note (Signed)
Re-ordered Cologuard today for colon cancer screening

## 2021-03-18 NOTE — Assessment & Plan Note (Signed)
Last lipid panel in June 2022 showed total cholesterol 212 and LDL of 143.  Patient did not think she had a cholesterol problem so she has not been taking her pravastatin.  Her ASCVD score is 31.1%.  Will start patient on a high intensity statin and continue to encourage lifestyle modifications.  Plan: -- Start Crestor 20 mg daily -- Continue lifestyle modifications with dietary changes and exercise

## 2021-03-18 NOTE — Assessment & Plan Note (Addendum)
Patient here to follow-up on her diabetes.  A1c down to 7.9% from 8.0% in June 2022. Patient only on metformin 1000 mg twice daily. She was previously on Jardiance but stopped taking this and wanted to continue lifestyle modifications. A1c still not at goal.  Recommended going back on SGLT2 inhibitor or GLP-1 to help with weight loss however patient does not want to start any new medications and would like to continue lifestyle modifications with her aquatic exercises and aerobics at the [facility]. Patient counseled on decreasing sugary foods and calorie intake.  Plan: --Continue metformin 1000 mg BID --Continue lifestyle changes --Follow up in 3 months for repeat A1c, revisit discussion about starting SGLT2i or GLP-1

## 2021-03-19 NOTE — Progress Notes (Signed)
Internal Medicine Clinic Attending  Case discussed with Dr. Amponsah  At the time of the visit.  We reviewed the resident's history and exam and pertinent patient test results.  I agree with the assessment, diagnosis, and plan of care documented in the resident's note.  

## 2021-04-21 ENCOUNTER — Other Ambulatory Visit: Payer: Self-pay | Admitting: Internal Medicine

## 2021-04-21 DIAGNOSIS — I10 Essential (primary) hypertension: Secondary | ICD-10-CM

## 2021-05-14 ENCOUNTER — Other Ambulatory Visit: Payer: Self-pay | Admitting: Internal Medicine

## 2021-05-14 DIAGNOSIS — I10 Essential (primary) hypertension: Secondary | ICD-10-CM

## 2021-07-19 ENCOUNTER — Encounter: Payer: Self-pay | Admitting: *Deleted

## 2021-09-26 ENCOUNTER — Ambulatory Visit (INDEPENDENT_AMBULATORY_CARE_PROVIDER_SITE_OTHER): Payer: Medicare HMO

## 2021-09-26 VITALS — BP 162/79 | HR 68 | Temp 98.4°F | Ht 61.0 in | Wt 188.8 lb

## 2021-09-26 VITALS — BP 187/69 | HR 68 | Temp 98.4°F | Ht 61.0 in | Wt 188.8 lb

## 2021-09-26 DIAGNOSIS — E782 Mixed hyperlipidemia: Secondary | ICD-10-CM

## 2021-09-26 DIAGNOSIS — Z0000 Encounter for general adult medical examination without abnormal findings: Secondary | ICD-10-CM | POA: Diagnosis not present

## 2021-09-26 DIAGNOSIS — E119 Type 2 diabetes mellitus without complications: Secondary | ICD-10-CM | POA: Diagnosis not present

## 2021-09-26 DIAGNOSIS — E785 Hyperlipidemia, unspecified: Secondary | ICD-10-CM

## 2021-09-26 DIAGNOSIS — I10 Essential (primary) hypertension: Secondary | ICD-10-CM | POA: Diagnosis not present

## 2021-09-26 DIAGNOSIS — S39012A Strain of muscle, fascia and tendon of lower back, initial encounter: Secondary | ICD-10-CM | POA: Diagnosis not present

## 2021-09-26 DIAGNOSIS — Z87891 Personal history of nicotine dependence: Secondary | ICD-10-CM

## 2021-09-26 LAB — GLUCOSE, CAPILLARY: Glucose-Capillary: 155 mg/dL — ABNORMAL HIGH (ref 70–99)

## 2021-09-26 LAB — POCT GLYCOSYLATED HEMOGLOBIN (HGB A1C): Hemoglobin A1C: 8.4 % — AB (ref 4.0–5.6)

## 2021-09-26 MED ORDER — AMLODIPINE BESYLATE 10 MG PO TABS: 10.0000 mg | ORAL_TABLET | Freq: Every day | ORAL | 3 refills | Status: DC

## 2021-09-26 MED ORDER — EMPAGLIFLOZIN 10 MG PO TABS: 10.0000 mg | ORAL_TABLET | Freq: Every day | ORAL | 1 refills | Status: AC

## 2021-09-26 NOTE — Patient Instructions (Signed)

## 2021-09-26 NOTE — Patient Instructions (Signed)
Leslie White, it was a pleasure seeing you today! You endorsed feeling well today. Below are some of the things we talked about this visit. We look forward to seeing you in the follow up appointment!  Today we discussed: Back pain: I think your back pain is due to muscle soreness. See the stretches attached here which you can do at home. You can also use an ice pack or heating pack, as well as over the counter lidocaine patches or voltaren gel. Let us know if your pain is getting worse or you have new symptoms.  Diabetes: Please start taking the jardiance every day in addition to your metformin. This medicine not only helps with diabetes but also to protect your kidneys and heart. Let us know if we can help you make the diet changes you talked about.  Blood pressure: Please start taking the amlodipine every day in addition to your other blood pressure meds. We'll recheck your BP in 1 month.  Cholesterol: We recommend you take a statin to help lower the cholesterol. We can also talk about other options at your next visit.  General maintenance: Please return the colon cancer screening kit we sent you with a sample.  I recommend you get the pneumonia vaccine, flu shot, covid booster, shingles vaccine, and TDAP at your local pharmacy.   I have ordered the following labs today:   Lab Orders         BMP8+Anion Gap         Lipid Profile         Microalbumin / Creatinine Urine Ratio         POC Hbg A1C       Referrals ordered today:   Referral Orders  No referral(s) requested today     I have ordered the following medication/changed the following medications:   Stop the following medications: Medications Discontinued During This Encounter  Medication Reason   amLODipine (NORVASC) 10 MG tablet Reorder     Start the following medications: Meds ordered this encounter  Medications   empagliflozin (JARDIANCE) 10 MG TABS tablet    Sig: Take 1 tablet (10 mg total) by mouth  daily before breakfast.    Dispense:  30 tablet    Refill:  1   amLODipine (NORVASC) 10 MG tablet    Sig: Take 1 tablet (10 mg total) by mouth daily.    Dispense:  90 tablet    Refill:  3     Follow-up:  1 month    Please make sure to arrive 15 minutes prior to your next appointment. If you arrive late, you may be asked to reschedule.   We look forward to seeing you next time. Please call our clinic at [phone] if you have any questions or concerns. The best time to call is Monday-Friday from 9am-4pm, but there is someone available 24/7. If after hours or the weekend, call the main hospital number and ask for the Internal Medicine Resident On-Call. If you need medication refills, please notify your pharmacy one week in advance and they will send [place] a request.  Thank you for letting us take part in your care. Wishing you the best!  Thank you, Linus Galas MD

## 2021-09-26 NOTE — Progress Notes (Signed)
Subjective:   Leslie White is a 66 y.o. female who presents for an Initial Medicare Annual Wellness Visit.  Review of Systems    Defer to PCP.        Objective:    Today's Vitals   09/26/21 1053 09/26/21 1054  BP: (!) 162/79   Pulse: 68   Temp: 98.4 F (36.9 C)   TempSrc: Oral   SpO2: 100%   Weight: 188 lb 12.8 oz (85.6 kg)   Height: 5\' 1"  (1.549 m)   PainSc:  2    Body mass index is 35.67 kg/m.     09/26/2021   10:58 AM 09/26/2021   10:18 AM 03/17/2021   10:13 AM 07/15/2020    9:49 AM 12/16/2019   11:23 AM 06/14/2019    9:03 AM 01/03/2018   10:16 AM  Advanced Directives  Does Patient Have a Medical Advance Directive? No No Yes Yes No No No  Type of 14/09/2017 of [place];Living will Living will;Healthcare Power of Attorney     Does patient want to make changes to medical advance directive?   No - Patient declined No - Patient declined     Copy of Healthcare Power of Attorney in Chart?   No - copy requested No - copy requested     Would patient like information on creating a medical advance directive? No - Patient declined No - Patient declined   No - Patient declined No - Patient declined No - Patient declined    Current Medications (verified) Outpatient Encounter Medications as of 09/26/2021  Medication Sig   hydrochlorothiazide (HYDRODIURIL) 25 MG tablet TAKE 1 TABLET (25 MG TOTAL) BY MOUTH DAILY.   metFORMIN (GLUCOPHAGE) 1000 MG tablet TAKE 1 TABLET BY MOUTH TWO TIMES A DAY WITH MEALS   amLODipine (NORVASC) 10 MG tablet Take 1 tablet (10 mg total) by mouth daily.   Ergocalciferol (VITAMIN D2 PO) Take 1 capsule by mouth See admin instructions. 1.25mg . Take one capsule by mouth every two weeks. No specific day she takes it on.   glucose blood test strip Use as instructed   lisinopril (ZESTRIL) 40 MG tablet TAKE 1 TABLET BY MOUTH EVERY DAY   metoprolol tartrate (LOPRESSOR) 100 MG tablet TAKE 1 TABLET BY MOUTH TWICE A DAY   ONETOUCH  DELICA LANCETS 33G MISC Use as directed   rosuvastatin (CRESTOR) 20 MG tablet Take 1 tablet (20 mg total) by mouth daily.   No facility-administered encounter medications on file as of 09/26/2021.    Allergies (verified) Codeine   History: Past Medical History:  Diagnosis Date   Diabetes mellitus without complication (HCC)    Hypertension    Past Surgical History:  Procedure Laterality Date   APPENDECTOMY     CESAREAN SECTION     History reviewed. No pertinent family history. Social History   Socioeconomic History   Marital status: Married    Spouse name: Not on file   Number of children: Not on file   Years of education: Not on file   Highest education level: Not on file  Occupational History   Not on file  Tobacco Use   Smoking status: Former    Types: Cigarettes    Quit date: 09/08/1987    Years since quitting: 34.0   Smokeless tobacco: Never  Vaping Use   Vaping Use: Never used  Substance and Sexual Activity   Alcohol use: No    Alcohol/week: 0.0 standard drinks of alcohol   Drug  use: No   Sexual activity: Not Currently  Other Topics Concern   Not on file  Social History Narrative   Not on file   Social Determinants of Health   Financial Resource Strain: Low Risk  (09/26/2021)   Overall Financial Resource Strain (CARDIA)    Difficulty of Paying Living Expenses: Not hard at all  Food Insecurity: No Food Insecurity (09/26/2021)   Hunger Vital Sign    Worried About Running Out of Food in the Last Year: Never true    Ran Out of Food in the Last Year: Never true  Transportation Needs: No Transportation Needs (09/26/2021)   PRAPARE - Administrator, Civil Service (Medical): No    Lack of Transportation (Non-Medical): No  Physical Activity: Sufficiently Active (09/26/2021)   Exercise Vital Sign    Days of Exercise per Week: 4 days    Minutes of Exercise per Session: 60 min  Stress: No Stress Concern Present (09/26/2021)   [employer] of Occupational  Health - Occupational Stress Questionnaire    Feeling of Stress : Not at all  Social Connections: Moderately Integrated (09/26/2021)   Social Connection and Isolation Panel [NHANES]    Frequency of Communication with Friends and Family: More than three times a week    Frequency of Social Gatherings with Friends and Family: More than three times a week    Attends Religious Services: More than 4 times per year    Active Member of [employer] or Organizations: No    Attends Engineer, structural: Never    Marital Status: Married    Tobacco Counseling Counseling given: Not Answered   Clinical Intake:  Pre-visit preparation completed: Yes  Pain : 0-10 Pain Score: 2  Pain Type: Chronic pain Pain Location: Back Pain Orientation: Lower Pain Descriptors / Indicators: Aching, Spasm, Tightness Pain Onset: More than a month ago Pain Frequency: Constant     BMI - recorded: 35.67 Nutritional Status: BMI > 30  Obese Nutritional Risks: None Diabetes: Yes CBG done?: No Did pt. bring in CBG monitor from home?: No  How often do you need to have someone help you when you read instructions, pamphlets, or other written materials from your doctor or pharmacy?: 1 - Never What is the last grade level you completed in school?: 16 years of school  Diabetic?YES  Interpreter Needed?: No  Information entered by :: Diahann Guajardo, CMA 09/26/2021 10:56AM   Activities of Daily Living    09/26/2021   10:24 AM 09/26/2021   10:18 AM  In your present state of health, do you have any difficulty performing the following activities:  Hearing? 0 0  Vision? 0 0  Difficulty concentrating or making decisions? 0 0  Walking or climbing stairs? 0 0  Dressing or bathing? 0 0  Doing errands, shopping? 0 0  Preparing Food and eating ? N   Using the Toilet? N   In the past six months, have you accidently leaked urine? N   Do you have problems with loss of bowel control? N   Managing your Medications? N   Managing  your Finances? N   Housekeeping or managing your Housekeeping? N     Patient Care Team: Karoline Caldwell, MD as PCP - General Dr. Geradine Girt, D.O. as Consulting Physician (Optometry)  Indicate any recent Medical Services you may have received from other than [facility] providers in the past year (date may be approximate).     Assessment:   This is a  routine wellness examination for [place].  Hearing/Vision screen No results found.  Dietary issues and exercise activities discussed: Current Exercise Habits: Home exercise routine, Type of exercise: walking, Time (Minutes): 60, Frequency (Times/Week): 4, Weekly Exercise (Minutes/Week): 240, Intensity: Mild   Goals Addressed   None   Depression Screen    09/26/2021   10:59 AM 09/26/2021   10:22 AM 09/26/2021   10:18 AM 03/17/2021   10:17 AM 07/15/2020    9:48 AM 06/14/2019   10:51 AM 11/16/2018    9:29 AM  PHQ 2/9 Scores  PHQ - 2 Score 0 0 0 0 0 1 1  PHQ- 9 Score     2 2 4     Fall Risk    09/26/2021   10:58 AM 09/26/2021   10:18 AM 03/17/2021   10:17 AM 07/15/2020    8:56 AM 06/14/2019    8:57 AM  Fall Risk   Falls in the past year? 0 0 0 0 0  Number falls in past yr: 0 0  0   Injury with Fall? 0 0  0   Risk for fall due to : No Fall Risks No Fall Risks  No Fall Risks   Follow up Falls evaluation completed Falls evaluation completed Falls evaluation completed Falls evaluation completed Falls evaluation completed    FALL RISK PREVENTION PERTAINING TO THE HOME:  Any stairs in or around the home? No  If so, are there any without handrails? No  Home free of loose throw rugs in walkways, pet beds, electrical cords, etc? Yes  Adequate lighting in your home to reduce risk of falls? Yes   ASSISTIVE DEVICES UTILIZED TO PREVENT FALLS:  Life alert? No  Use of a cane, walker or w/c? No  Grab bars in the bathroom? No  Shower chair or bench in shower? No  Elevated toilet seat or a handicapped toilet? No   TIMED UP AND GO:  Was  the test performed? No .  Length of time to ambulate 10 feet: N/A sec.     Cognitive Function:        09/26/2021   10:25 AM  6CIT Screen  What Year? 0 points  What month? 0 points  What time? 0 points  Count back from 20 0 points  Months in reverse 0 points  Repeat phrase 0 points  Total Score 0 points    Immunizations Immunization History  Administered Date(s) Administered   Moderna Sars-Covid-2 Vaccination 10/18/2019, 11/15/2019    TDAP status: Due, Education has been provided regarding the importance of this vaccine. Advised may receive this vaccine at local pharmacy or Health Dept. Aware to provide a copy of the vaccination record if obtained from local pharmacy or Health Dept. Verbalized acceptance and understanding.  Flu Vaccine status: Due, Education has been provided regarding the importance of this vaccine. Advised may receive this vaccine at local pharmacy or Health Dept. Aware to provide a copy of the vaccination record if obtained from local pharmacy or Health Dept. Verbalized acceptance and understanding.  Pneumococcal vaccine status: Due, Education has been provided regarding the importance of this vaccine. Advised may receive this vaccine at local pharmacy or Health Dept. Aware to provide a copy of the vaccination record if obtained from local pharmacy or Health Dept. Verbalized acceptance and understanding.  Covid-19 vaccine status: Completed vaccines  Qualifies for Shingles Vaccine? Yes   Zostavax completed No   Shingrix Completed?: No.    Education has been provided regarding the importance of this  vaccine. Patient has been advised to call insurance company to determine out of pocket expense if they have not yet received this vaccine. Advised may also receive vaccine at local pharmacy or Health Dept. Verbalized acceptance and understanding.  Screening Tests Health Maintenance  Topic Date Due   TETANUS/TDAP  Never done   COLON CANCER SCREENING ANNUAL FOBT   Never done   COLONOSCOPY (Pts 45-1756yrs Insurance coverage will need to be confirmed)  Never done   Zoster Vaccines- Shingrix (1 of 2) Never done   OPHTHALMOLOGY EXAM  10/13/2013   Pneumonia Vaccine 6865+ Years old (1 - PCV) Never done   DEXA SCAN  Never done   COVID-19 Vaccine (3 - Moderna series) 01/10/2020   FOOT EXAM  07/15/2021   INFLUENZA VACCINE  Never done   HEMOGLOBIN A1C  12/26/2021   MAMMOGRAM  07/25/2022   Hepatitis C Screening  Completed   HPV VACCINES  Aged Out    Health Maintenance  Health Maintenance Due  Topic Date Due   TETANUS/TDAP  Never done   COLON CANCER SCREENING ANNUAL FOBT  Never done   COLONOSCOPY (Pts 45-3956yrs Insurance coverage will need to be confirmed)  Never done   Zoster Vaccines- Shingrix (1 of 2) Never done   OPHTHALMOLOGY EXAM  10/13/2013   Pneumonia Vaccine 4565+ Years old (1 - PCV) Never done   DEXA SCAN  Never done   COVID-19 Vaccine (3 - Moderna series) 01/10/2020   FOOT EXAM  07/15/2021   INFLUENZA VACCINE  Never done    Colorectal cancer screening: Defer to PCP.   Mammogram status: Completed 07/24/2020. Repeat every year  Bone Density status: Defer to PCP.  Lung Cancer Screening: (Low Dose CT Chest recommended if Age 38-80 years, 30 pack-year currently smoking OR have quit w/in 15years.) does not qualify.   Lung Cancer Screening Referral: Defer to PCP.   Additional Screening:  Hepatitis C Screening: does qualify; Completed 11/16/2018  Vision Screening: Recommended annual ophthalmology exams for early detection of glaucoma and other disorders of the eye. Is the patient up to date with their annual eye exam?  No  Who is the provider or what is the name of the office in which the patient attends annual eye exams? Defer to PCP. If pt is not established with a provider, would they like to be referred to a provider to establish care? No .   Dental Screening: Recommended annual dental exams for proper oral hygiene  Community Resource  Referral / Chronic Care Management: CRR required this visit?  No   CCM required this visit?  No      Plan:     I have personally reviewed and noted the following in the patient's chart:   Medical and social history Use of alcohol, tobacco or illicit drugs  Current medications and supplements including opioid prescriptions. Patient is not currently taking opioid prescriptions. Functional ability and status Nutritional status Physical activity Advanced directives List of other physicians Hospitalizations, surgeries, and ER visits in previous 12 months Vitals Screenings to include cognitive, depression, and falls Referrals and appointments  In addition, I have reviewed and discussed with patient certain preventive protocols, quality metrics, and best practice recommendations. A written personalized care plan for preventive services as well as general preventive health recommendations were provided to patient.     Khaylee Mcevoy, CMA   09/26/2021   Nurse Notes: Face to Face, 15 minutes.  [name] , Thank you for taking time to come for your Medicare  Wellness Visit. I appreciate your ongoing commitment to your health goals. Please review the following plan we discussed and let me know if I can assist you in the future.   These are the goals we discussed:  Goals      Blood Pressure < 140/90     HEMOGLOBIN A1C < 7.0     LDL CALC < 100        This is a list of the screening recommended for you and due dates:  Health Maintenance  Topic Date Due   Tetanus Vaccine  Never done   Stool Blood Test  Never done   Colon Cancer Screening  Never done   Zoster (Shingles) Vaccine (1 of 2) Never done   Eye exam for diabetics  10/13/2013   Pneumonia Vaccine (1 - PCV) Never done   DEXA scan (bone density measurement)  Never done   COVID-19 Vaccine (3 - Moderna series) 01/10/2020   Complete foot exam   07/15/2021   Flu Shot  Never done   Hemoglobin A1C  12/26/2021   Mammogram   07/25/2022   Hepatitis C Screening: USPSTF Recommendation to screen - Ages 18-79 yo.  Completed   HPV Vaccine  Aged Out

## 2021-09-26 NOTE — Assessment & Plan Note (Signed)
Crestor 20 added at last visit d\t persistently elevated LDL.  Patient declines this and never picked up from pharmacy. Adamant that she does not want cholesterol medication at this time. Discussed risks of HLD. -Consider approaching topic again at next visit

## 2021-09-26 NOTE — Assessment & Plan Note (Signed)
Asymptomatic with elevated BP in clinic today. Not taking Amlodipine at this time. -refilled and encouraged amlodipine 10mg  -discussed limiting salt in diet and continued exercise program -f\u in 1 month for repeat BP check and consider additional changes at that time.

## 2021-09-26 NOTE — Assessment & Plan Note (Addendum)
-  Eye exam in April at [facility] in [place]. Asked her to contact them to forward records to [place]. Foot exam today -discussed returning FIT home kit -states she has mammogram scheduled -recommended TDap, Flu, covid booster, Shingles, Pna vaccine at pharmacy

## 2021-09-26 NOTE — Assessment & Plan Note (Addendum)
A1c today 8.4. Patient currently only taking Metformin 1000 in morning and 500 at night d\t stomach upset. She says decreasing the dose helped. Reports some numbness and tingling in fingertips and toes. No vision changes. Feels like she has to urinate very frequently since she increased her fluid intake, possibly overflow incontinence. -had extensive discussion about diabetes treatment options and necessity for secondary agent. She declined GLP1 and insulin, but was willing to try jardiance. Started jardiance 10 daily -discussed dietary modifications and encouraged continued [facility] program at [facility] -urine microalbumin/Cr, BMP, and lipid profile today.

## 2021-09-26 NOTE — Assessment & Plan Note (Signed)
Has achey pain in lower back around paraspinal muscles for past 1 month. Has been helping move and turn her husband for past 3 months as he is currently hospice at home. No urinary or bowel incontinence. No fevers, no falls. No acute numbness or weakness or saddle paresthesias. No TTP over spine on exam, negative CVA tenderness. She does not want to take any medicines for pain as it is not significantly decreasing her quality of life at this point. -likely MSK strain from oveexertion -provided print out for lower back exercises -recommended ice/heat

## 2021-09-26 NOTE — Progress Notes (Signed)
   CC: f\u chronic conditions, back pain  HPI:  [name] is a 66 y.o.-year-old female with past medical history as below presenting for back pain and f\u of chronic conditions.  Please see encounters tab for problem-based charting.  Past Medical History:  Diagnosis Date   Diabetes mellitus without complication ([place])    Hypertension    Review of Systems: As in HPI.  Please see encounters tab for problem is charting.   Physical Exam:  Vitals:   09/26/21 1015 09/26/21 1029  BP: (!) 187/69 (!) 187/69  Pulse: 71 68  Temp: 98.4 F (36.9 C)   TempSrc: Oral   SpO2: 100%   Weight: 188 lb 12.8 oz (85.6 kg)   Height: _0  (1.549 m)    General:Well-appearing, pleasant, In NAD Cardiac: RRR, no murmurs rubs or gallops. Respiratory: Normal work of breathing on room air, CTAB Abdominal: Soft, nontender, nondistended, no CVA tenderness MSK: no TTP over spine or paraspinal muscles Neuro: 5/5 strength and intact sensation in extremities   Assessment & Plan:   Lumbar strain Has achey pain in lower back around paraspinal muscles for past 1 month. Has been helping move and turn her husband for past 3 months as he is currently [facility] at home. No urinary or bowel incontinence. No fevers, no falls. No acute numbness or weakness or saddle paresthesias. No TTP over spine on exam, negative CVA tenderness. She does not want to take any medicines for pain as it is not significantly decreasing her quality of life at this point. -likely MSK strain from oveexertion -provided print out for lower back exercises -recommended ice/heat   Preventative health care -Eye exam in April at [facility] in [place]. Asked her to contact them to forward records to [place]. Foot exam today -discussed returning FIT home kit -states she has mammogram scheduled -recommended TDap, Flu, covid booster, Shingles, Pna vaccine at pharmacy  Diabetes mellitus without complication I3K today 8.4. Patient  currently only taking Metformin 1000 in morning and 500 at night d\t stomach upset. She says decreasing the dose helped. Reports some numbness and tingling in fingertips and toes. No vision changes. Feels like she has to urinate very frequently since she increased her fluid intake, possibly overflow incontinence. -had extensive discussion about diabetes treatment options and necessity for secondary agent. She declined GLP1 and insulin, but was willing to try jardiance. Started jardiance 10 daily -discussed dietary modifications and encouraged continued exercise program at [facility] -urine microalbumin/Cr, BMP, and lipid profile today.  Hypertension Asymptomatic with elevated BP in clinic today. Not taking Amlodipine at this time. -refilled and encouraged amlodipine 47m -discussed limiting salt in diet and continued exercise program -f\u in 1 month for repeat BP check and consider additional changes at that time.  Hyperlipidemia Crestor 20 added at last visit d\t persistently elevated LDL.  Patient declines this and never picked up from pharmacy. Adamant that she does not want cholesterol medication at this time. Discussed risks of HLD. -Consider approaching topic again at next visit   Patient seen with Dr.  LSaverio Danker

## 2021-09-27 LAB — LIPID PANEL
Chol/HDL Ratio: 4.4 ratio (ref 0.0–4.4)
Cholesterol, Total: 203 mg/dL — ABNORMAL HIGH (ref 100–199)
HDL: 46 mg/dL (ref >39)
LDL Chol Calc (NIH): 142 mg/dL — ABNORMAL HIGH (ref 0–99)
Triglycerides: 85 mg/dL (ref 0–149)
VLDL Cholesterol Cal: 15 mg/dL (ref 5–40)

## 2021-09-27 LAB — BMP8+ANION GAP
Anion Gap: 15 mmol/L (ref 10.0–18.0)
BUN/Creatinine Ratio: 22 (ref 12–28)
BUN: 21 mg/dL (ref 8–27)
CO2: 26 mmol/L (ref 20–29)
Calcium: 9.9 mg/dL (ref 8.7–10.3)
Chloride: 98 mmol/L (ref 96–106)
Creatinine, Ser: 0.95 mg/dL (ref 0.57–1.00)
Glucose: 137 mg/dL — ABNORMAL HIGH (ref 70–99)
Potassium: 4.9 mmol/L (ref 3.5–5.2)
Sodium: 139 mmol/L (ref 134–144)
eGFR: 66 mL/min/{1.73_m2} (ref >59)

## 2021-09-28 LAB — MICROALBUMIN / CREATININE URINE RATIO
Creatinine, Urine: 21.8 mg/dL
Microalb/Creat Ratio: 349 mg/g creat — ABNORMAL HIGH (ref 0–29)
Microalbumin, Urine: 76.1 ug/mL

## 2021-09-30 NOTE — Progress Notes (Signed)
Internal Medicine Clinic Attending  I saw and evaluated the patient.  I personally confirmed the key portions of the history and exam documented by Dr. Marjie Skiff and I reviewed pertinent patient test results.  The assessment, diagnosis, and plan were formulated together and I agree with the documentation in the resident's note. Starting SGLT2i for further diabetes management. Urine ACR >300, SGLT2i for nephroprotection but would likely also benefit from ACEi/ARB. Consider switching amlodipine at f/u visit. Discussed recommendation for statin therapy given elevated cholesterol and diabetes, patient declines.

## 2021-10-06 ENCOUNTER — Other Ambulatory Visit: Payer: Self-pay | Admitting: Internal Medicine

## 2021-10-06 DIAGNOSIS — I10 Essential (primary) hypertension: Secondary | ICD-10-CM

## 2021-10-07 NOTE — Progress Notes (Signed)
I reviewed the AWV findings with the provider who conducted the visit. I was present in the office suite and immediately available to provide assistance and direction throughout the time the service was provided.  

## 2021-11-20 ENCOUNTER — Other Ambulatory Visit: Payer: Self-pay | Admitting: Internal Medicine

## 2021-11-20 DIAGNOSIS — I10 Essential (primary) hypertension: Secondary | ICD-10-CM

## 2022-02-18 ENCOUNTER — Other Ambulatory Visit: Payer: Self-pay | Admitting: Internal Medicine

## 2022-02-18 DIAGNOSIS — I10 Essential (primary) hypertension: Secondary | ICD-10-CM

## 2022-02-19 ENCOUNTER — Other Ambulatory Visit: Payer: Self-pay

## 2022-02-19 DIAGNOSIS — E782 Mixed hyperlipidemia: Secondary | ICD-10-CM

## 2022-02-19 DIAGNOSIS — I10 Essential (primary) hypertension: Secondary | ICD-10-CM

## 2022-02-19 MED ORDER — METOPROLOL TARTRATE 100 MG PO TABS: 100.0000 mg | ORAL_TABLET | Freq: Two times a day (BID) | ORAL | 1 refills | Status: DC

## 2022-02-19 MED ORDER — ROSUVASTATIN CALCIUM 20 MG PO TABS: 20.0000 mg | ORAL_TABLET | Freq: Every day | ORAL | 1 refills | Status: DC

## 2022-02-19 MED ORDER — LISINOPRIL 40 MG PO TABS: 40.0000 mg | ORAL_TABLET | Freq: Every day | ORAL | 2 refills | Status: DC

## 2022-02-19 MED ORDER — AMLODIPINE BESYLATE 10 MG PO TABS: 10.0000 mg | ORAL_TABLET | Freq: Every day | ORAL | 3 refills | Status: DC

## 2022-03-06 ENCOUNTER — Other Ambulatory Visit: Payer: Self-pay | Admitting: Internal Medicine

## 2022-03-06 DIAGNOSIS — E119 Type 2 diabetes mellitus without complications: Secondary | ICD-10-CM

## 2022-04-20 NOTE — Progress Notes (Deleted)
CC: Diabetes and hypertension  HPI:  [name] is a 67 y.o. female living with a history stated below and presents today for follow-up of diabetes and hypertension. Please see problem based assessment and plan for additional details.  Past Medical History:  Diagnosis Date   Diabetes mellitus without complication ([place])    Hypertension     Current Outpatient Medications on File Prior to Visit  Medication Sig Dispense Refill   amLODipine (NORVASC) 10 MG tablet Take 1 tablet (10 mg total) by mouth daily. 90 tablet 3   empagliflozin (JARDIANCE) 10 MG TABS tablet Take 1 tablet (10 mg total) by mouth daily before breakfast. 30 tablet 1   Ergocalciferol (VITAMIN D2 PO) Take 1 capsule by mouth See admin instructions. 1.25mg . Take one capsule by mouth every two weeks. No specific day she takes it on.     glucose blood test strip Use as instructed 100 each 12   hydrochlorothiazide (HYDRODIURIL) 25 MG tablet TAKE 1 TABLET (25 MG TOTAL) BY MOUTH DAILY. 90 tablet 3   lisinopril (ZESTRIL) 40 MG tablet Take 1 tablet (40 mg total) by mouth daily. 90 tablet 2   metFORMIN (GLUCOPHAGE) 1000 MG tablet TAKE 1 TABLET BY MOUTH TWO TIMES A DAY WITH MEALS. 180 tablet 3   metoprolol tartrate (LOPRESSOR) 100 MG tablet Take 1 tablet (100 mg total) by mouth 2 (two) times daily. 180 tablet 1   ONETOUCH DELICA LANCETS 99991111 MISC Use as directed 100 each 2   rosuvastatin (CRESTOR) 20 MG tablet Take 1 tablet (20 mg total) by mouth daily. 90 tablet 1   No current facility-administered medications on file prior to visit.    No family history on file.  Social History   Socioeconomic History   Marital status: Married    Spouse name: Not on file   Number of children: Not on file   Years of education: Not on file   Highest education level: Not on file  Occupational History   Not on file  Tobacco Use   Smoking status: Former    Types: Cigarettes    Quit date: 09/08/1987    Years since  quitting: 34.6   Smokeless tobacco: Never  Vaping Use   Vaping Use: Never used  Substance and Sexual Activity   Alcohol use: No    Alcohol/week: 0.0 standard drinks of alcohol   Drug use: No   Sexual activity: Not Currently  Other Topics Concern   Not on file  Social History Narrative   Not on file   Social Determinants of Health   Financial Resource Strain: Low Risk  (09/26/2021)   Overall Financial Resource Strain (CARDIA)    Difficulty of Paying Living Expenses: Not hard at all  Food Insecurity: No Food Insecurity (09/26/2021)   Hunger Vital Sign    Worried About Running Out of Food in the Last Year: Never true    Herald in the Last Year: Never true  Transportation Needs: No Transportation Needs (09/26/2021)   PRAPARE - Hydrologist (Medical): No    Lack of Transportation (Non-Medical): No  Physical Activity: Sufficiently Active (09/26/2021)   Exercise Vital Sign    Days of Exercise per Week: 4 days    Minutes of Exercise per Session: 60 min  Stress: No Stress Concern Present (09/26/2021)   [place]    Feeling of Stress : Not at all  Social Connections: Moderately Integrated (  09/26/2021)   Social Connection and Isolation Panel [NHANES]    Frequency of Communication with Friends and Family: More than three times a week    Frequency of Social Gatherings with Friends and Family: More than three times a week    Attends Religious Services: More than 4 times per year    Active Member of [employer] or Organizations: No    Attends Archivist Meetings: Never    Marital Status: Married  Human resources officer Violence: Not At Risk (09/26/2021)   Humiliation, Afraid, Rape, and Kick questionnaire    Fear of Current or Ex-Partner: No    Emotionally Abused: No    Physically Abused: No    Sexually Abused: No    Review of Systems: ROS negative except for what is noted on the assessment and  plan.  There were no vitals filed for this visit.  Physical Exam: Constitutional: well-appearing *** sitting in ***, in no acute distress HENT: normocephalic atraumatic, mucous membranes moist Eyes: conjunctiva non-erythematous Cardiovascular: regular rate and rhythm, no m/r/g Pulmonary/Chest: normal work of breathing on room air, lungs clear to auscultation bilaterally Abdominal: soft, non-tender, non-distended MSK: normal bulk and tone Neurological: alert & oriented x 3, no focal deficit Skin: warm and dry Psych: normal mood and behavior  Assessment & Plan:   Diabetes: A1c 8.4% > -metformin 1000 every morning, 500 at night -Declined GLP and insulin -jardiance 10 - foot - ophtho  HTN: -Amlodipine 10  Hcm: scope?  Patient {GC/GE:[id number]::"discussed with","seen with"} Dr. LF:[id number]. Hoffman","Mullen","Narendra","Vincent","Guilloud","Lau","Machen"}  No problem-specific Assessment & Plan notes found for this encounter.   Buddy Duty, D.O. [facility], PGY-2 Phone: [phone] Date 04/20/2022 Time 1:34 PM

## 2022-04-21 ENCOUNTER — Encounter: Payer: Medicare HMO | Admitting: Internal Medicine

## 2022-06-10 ENCOUNTER — Telehealth: Payer: Self-pay

## 2022-06-10 NOTE — Telephone Encounter (Signed)
Called patient to schedule Medicare Annual Wellness Visit (AWV). Left message for patient to call back and schedule Medicare Annual Wellness Visit (AWV).  Last date of AWV: 09/26/2021  Please schedule an appointment at any time with [facility].  If any questions, please contact me at [phone].  Thank you ,  Randon Goldsmith Care Guide [facility] AWV TEAM Direct Dial: [phone]

## 2022-08-22 ENCOUNTER — Other Ambulatory Visit: Payer: Self-pay | Admitting: Internal Medicine

## 2022-08-22 DIAGNOSIS — I10 Essential (primary) hypertension: Secondary | ICD-10-CM

## 2022-10-26 ENCOUNTER — Encounter: Payer: Medicare HMO | Admitting: Internal Medicine

## 2022-10-26 NOTE — Progress Notes (Deleted)
CC: diabetes, HTN follow up  HPI:  [name] is a 67 y.o. female living with a history stated below and presents today for a follow up of her diabetes and hypertension. Please see problem based assessment and plan for additional details.  Past Medical History:  Diagnosis Date   Diabetes mellitus without complication (HCC)    Hypertension     Current Outpatient Medications on File Prior to Visit  Medication Sig Dispense Refill   amLODipine (NORVASC) 10 MG tablet Take 1 tablet (10 mg total) by mouth daily. 90 tablet 3   empagliflozin (JARDIANCE) 10 MG TABS tablet Take 1 tablet (10 mg total) by mouth daily before breakfast. 30 tablet 1   Ergocalciferol (VITAMIN D2 PO) Take 1 capsule by mouth See admin instructions. 1.25mg . Take one capsule by mouth every two weeks. No specific day she takes it on.     glucose blood test strip Use as instructed 100 each 12   hydrochlorothiazide (HYDRODIURIL) 25 MG tablet TAKE 1 TABLET (25 MG TOTAL) BY MOUTH DAILY. 90 tablet 3   lisinopril (ZESTRIL) 40 MG tablet Take 1 tablet (40 mg total) by mouth daily. 90 tablet 2   metFORMIN (GLUCOPHAGE) 1000 MG tablet TAKE 1 TABLET BY MOUTH TWO TIMES A DAY WITH MEALS. 180 tablet 3   metoprolol tartrate (LOPRESSOR) 100 MG tablet Take 1 tablet (100 mg total) by mouth 2 (two) times daily. 180 tablet 1   ONETOUCH DELICA LANCETS 33G MISC Use as directed 100 each 2   rosuvastatin (CRESTOR) 20 MG tablet Take 1 tablet (20 mg total) by mouth daily. 90 tablet 1   No current facility-administered medications on file prior to visit.    No family history on file.  Social History   Socioeconomic History   Marital status: Married    Spouse name: Not on file   Number of children: Not on file   Years of education: Not on file   Highest education level: Not on file  Occupational History   Not on file  Tobacco Use   Smoking status: Former    Current packs/day: 0.00    Types: Cigarettes    Quit date:  09/08/1987    Years since quitting: 35.1   Smokeless tobacco: Never  Vaping Use   Vaping status: Never Used  Substance and Sexual Activity   Alcohol use: No    Alcohol/week: 0.0 standard drinks of alcohol   Drug use: No   Sexual activity: Not Currently  Other Topics Concern   Not on file  Social History Narrative   Not on file   Social Determinants of Health   Financial Resource Strain: Low Risk  (09/26/2021)   Overall Financial Resource Strain (CARDIA)    Difficulty of Paying Living Expenses: Not hard at all  Food Insecurity: No Food Insecurity (09/26/2021)   Hunger Vital Sign    Worried About Running Out of Food in the Last Year: Never true    Ran Out of Food in the Last Year: Never true  Transportation Needs: No Transportation Needs (09/26/2021)   PRAPARE - Administrator, Civil Service (Medical): No    Lack of Transportation (Non-Medical): No  Physical Activity: Sufficiently Active (09/26/2021)   Exercise Vital Sign    Days of Exercise per Week: 4 days    Minutes of Exercise per Session: 60 min  Stress: No Stress Concern Present (09/26/2021)   [employer] of Occupational Health - Occupational Stress Questionnaire    Feeling of  Stress : Not at all  Social Connections: Moderately Integrated (09/26/2021)   Social Connection and Isolation Panel [NHANES]    Frequency of Communication with Friends and Family: More than three times a week    Frequency of Social Gatherings with Friends and Family: More than three times a week    Attends Religious Services: More than 4 times per year    Active Member of [employer] or Organizations: No    Attends Banker Meetings: Never    Marital Status: Married  Catering manager Violence: Not At Risk (09/26/2021)   Humiliation, Afraid, Rape, and Kick questionnaire    Fear of Current or Ex-Partner: No    Emotionally Abused: No    Physically Abused: No    Sexually Abused: No    Review of Systems: ROS negative except for what is  noted on the assessment and plan.  There were no vitals filed for this visit.  Physical Exam: Constitutional: well-appearing *** sitting in ***, in no acute distress HENT: normocephalic atraumatic, mucous membranes moist Eyes: conjunctiva non-erythematous Cardiovascular: regular rate and rhythm, no m/r/g Pulmonary/Chest: normal work of breathing on room air, lungs clear to auscultation bilaterally Abdominal: soft, non-tender, non-distended MSK: normal bulk and tone Neurological: alert & oriented x 3, no focal deficit Skin: warm and dry Psych: normal mood and behavior  Assessment & Plan:   *last office visit 09/26/21  HTN: - amlodipine 10, lisinopril 40   DM: A1c 8.4% > - metfromin 1000 mg AM, 500 mg PM - jardiance 10 mg daily  - foot exam, ophtho  HLD: LDL 142 last year, declined statin   HCM: - cscope, tdap, flu  Patient {GC/GE:3044014::"discussed with","seen with"} Dr. {[id number]::"[id number]","G. Hoffman","Mullen","Narendra","Vincent","Guilloud","Lau","Machen"}  No problem-specific Assessment & Plan notes found for this encounter.   Elza Rafter, D.O. [facility], PGY-3 Phone: [phone] Date 10/26/2022 Time 7:07 AM

## 2022-12-03 LAB — HM MAMMOGRAPHY

## 2023-03-15 ENCOUNTER — Ambulatory Visit: Payer: Medicare HMO | Admitting: Student

## 2023-03-15 VITALS — BP 183/72 | HR 71 | Temp 98.0°F | Ht 61.0 in | Wt 186.3 lb

## 2023-03-15 DIAGNOSIS — I10 Essential (primary) hypertension: Secondary | ICD-10-CM | POA: Diagnosis not present

## 2023-03-15 DIAGNOSIS — E785 Hyperlipidemia, unspecified: Secondary | ICD-10-CM | POA: Diagnosis not present

## 2023-03-15 DIAGNOSIS — Z7984 Long term (current) use of oral hypoglycemic drugs: Secondary | ICD-10-CM | POA: Diagnosis not present

## 2023-03-15 DIAGNOSIS — E119 Type 2 diabetes mellitus without complications: Secondary | ICD-10-CM | POA: Diagnosis not present

## 2023-03-15 DIAGNOSIS — E782 Mixed hyperlipidemia: Secondary | ICD-10-CM

## 2023-03-15 LAB — GLUCOSE, CAPILLARY: Glucose-Capillary: 292 mg/dL — ABNORMAL HIGH (ref 70–99)

## 2023-03-15 LAB — POCT GLYCOSYLATED HEMOGLOBIN (HGB A1C): Hemoglobin A1C: 10.8 % — AB (ref 4.0–5.6)

## 2023-03-15 MED ORDER — ROSUVASTATIN CALCIUM 20 MG PO TABS: 20.0000 mg | ORAL_TABLET | Freq: Every day | ORAL | 1 refills | Status: AC

## 2023-03-15 MED ORDER — OLMESARTAN-AMLODIPINE-HCTZ 40-10-25 MG PO TABS: 1.0000 | ORAL_TABLET | Freq: Every morning | ORAL | 3 refills | Status: DC

## 2023-03-15 MED ORDER — METOPROLOL TARTRATE 100 MG PO TABS: 100.0000 mg | ORAL_TABLET | Freq: Two times a day (BID) | ORAL | 3 refills | Status: DC

## 2023-03-15 MED ORDER — METFORMIN HCL 1000 MG PO TABS: 500.0000 mg | ORAL_TABLET | Freq: Two times a day (BID) | ORAL | 3 refills | Status: AC

## 2023-03-15 MED ORDER — EMPAGLIFLOZIN 10 MG PO TABS: 10.0000 mg | ORAL_TABLET | Freq: Every day | ORAL | 3 refills | Status: AC

## 2023-03-15 NOTE — Assessment & Plan Note (Signed)
Hemoglobin A1c 10.9 from 8.4 a year ago.  Patient reported complete adherence to her metformin regimen.

## 2023-03-15 NOTE — Progress Notes (Unsigned)
CC: Annual wellness visit   HPI:  [name] is a 68 y.o. female living with a history stated below and presents today for annual wellness visit. Please see problem based assessment and plan for additional details.  Past Medical History:  Diagnosis Date   Diabetes mellitus without complication (HCC)    Hypertension     Current Outpatient Medications on File Prior to Visit  Medication Sig Dispense Refill   empagliflozin (JARDIANCE) 10 MG TABS tablet Take 1 tablet (10 mg total) by mouth daily before breakfast. 30 tablet 1   Ergocalciferol (VITAMIN D2 PO) Take 1 capsule by mouth See admin instructions. 1.25mg . Take one capsule by mouth every two weeks. No specific day she takes it on.     glucose blood test strip Use as instructed 100 each 12   ONETOUCH DELICA LANCETS 33G MISC Use as directed 100 each 2   No current facility-administered medications on file prior to visit.    No family history on file.  Social History   Socioeconomic History   Marital status: Married    Spouse name: Not on file   Number of children: Not on file   Years of education: Not on file   Highest education level: Not on file  Occupational History   Not on file  Tobacco Use   Smoking status: Former    Current packs/day: 0.00    Types: Cigarettes    Quit date: 09/08/1987    Years since quitting: 35.5   Smokeless tobacco: Never  Vaping Use   Vaping status: Never Used  Substance and Sexual Activity   Alcohol use: No    Alcohol/week: 0.0 standard drinks of alcohol   Drug use: No   Sexual activity: Not Currently  Other Topics Concern   Not on file  Social History Narrative   Not on file   Social Drivers of Health   Financial Resource Strain: Low Risk  (09/26/2021)   Overall Financial Resource Strain (CARDIA)    Difficulty of Paying Living Expenses: Not hard at all  Food Insecurity: No Food Insecurity (09/26/2021)   Hunger Vital Sign    Worried About Running Out of Food in  the Last Year: Never true    Ran Out of Food in the Last Year: Never true  Transportation Needs: No Transportation Needs (09/26/2021)   PRAPARE - Administrator, Civil Service (Medical): No    Lack of Transportation (Non-Medical): No  Physical Activity: Sufficiently Active (09/26/2021)   Exercise Vital Sign    Days of Exercise per Week: 4 days    Minutes of Exercise per Session: 60 min  Stress: No Stress Concern Present (09/26/2021)   [employer] of Occupational Health - Occupational Stress Questionnaire    Feeling of Stress : Not at all  Social Connections: Moderately Integrated (09/26/2021)   Social Connection and Isolation Panel [NHANES]    Frequency of Communication with Friends and Family: More than three times a week    Frequency of Social Gatherings with Friends and Family: More than three times a week    Attends Religious Services: More than 4 times per year    Active Member of [employer] or Organizations: No    Attends Banker Meetings: Never    Marital Status: Married  Catering manager Violence: Not At Risk (09/26/2021)   Humiliation, Afraid, Rape, and Kick questionnaire    Fear of Current or Ex-Partner: No    Emotionally Abused: No    Physically Abused:  No    Sexually Abused: No    Review of Systems: ROS negative except for what is noted on the assessment and plan.  Vitals:   03/15/23 0916 03/15/23 0943  BP: (!) 203/82 (!) 183/72  Pulse: 79 71  Temp: 98 F (36.7 C)   TempSrc: Oral   SpO2: 100%   Weight: 186 lb 4.8 oz (84.5 kg)   Height: 5\' 1"  (1.549 m)     Physical Exam: Constitutional: well-appearing woman , sitting in chair , in no acute distress Cardiovascular: regular rate and rhythm, no m/r/g Pulmonary/Chest: normal work of breathing on room air, lungs clear to auscultation bilaterally Abdominal: soft, non-tender, non-distended MSK: normal bulk and tone Neurological: alert & oriented x 3, no focal deficit Skin: warm and dry Psych:  normal mood and behavior  Assessment & Plan:   Essential hypertension Patient with a history of hypertension currently being managed with lisinopril ,amlodipine, HCTZ and metoprolol.  Patient in office today is 183/72.  However endorses complete adherence to her antihypertensives.  He has not been seen in the office in over a year.  He dispensary history however is consistent with adherence.  He denies any chest pain and has no acute concerns at this time.  I suspect that pill burden could be contributing to this patient not taking all her medications as prescribed even though she is picking them up from the pharmacy.  I will switch this patient to Tribenzor and hopefully taking 1 pill instead of 3 would help with adherence and a better blood pressure control.  Will get a BMP to assess her electrolytes and kidney functions. -BMP -Encourage DASH diet -Home blood pressure monitoring -Prescribe Tribenzor 40 - 10 - 25 mg daily  Diabetes mellitus without complication Hemoglobin A1c 10.9 from 8.4 a year ago.  Patient however report complete adherence to her metformin regimen.She thinks her diet and recent travelling due to her husband passing is contributing to her poor blood sugar control. Addition of Jardiance to her current regimen is discussed but she is worried about her co-pay. She agreed to sending the prescription in to see if she can afford it. Will check ACR today and prescribe Jardiance . Importance of diet and medication adherence and adequate blood sugar control discussed with the patient. Will follow up in 3 months.  Hyperlipidemia LDL of 142 in September 2024. Pt is on Crestor 20 mg. Endorses adherence and denies any myalgias at this time. Will recheck lipid profile today and adjust medication as needed.  Patient discussed with Dr. Dale Guayanilla, M.D [facility] Phone: [phone] Date 03/17/2023 Time 8:18 AM

## 2023-03-15 NOTE — Assessment & Plan Note (Signed)
Patient with a history of hypertension currently being managed with lisinopril ,amlodipine, HCTZ and metoprolol.  Patient in office today is 183/72.  However endorses complete adherence to her antihypertensives.  He has not been seen in the office in over a year.  He dispensary history however is consistent with adherence.  He denies any chest pain and has no acute concerns at this time.  I suspect that pill burden could be contributing to this patient not taking all her medications as prescribed even though she is picking them up from the pharmacy.  I will switch this patient to Tribenzor and hopefully taking 1 pill instead of 3 would help with adherence and a better blood pressure control.  Will get a BMP to assess her electrolytes and kidney functions. -BMP -Encourage DASH diet -Home blood pressure monitoring -Prescribe Tribenzor 40 - 10 - 25 mg daily

## 2023-03-15 NOTE — Patient Instructions (Addendum)
Thank you, Leslie White for allowing [place] to provide your care today. Today we discussed your general health and other chronic conditions.  I am switching your blood pressure medications to a combo pill, that way ,you will have less medications to take. The combo pill will have Olmesartan-amLODIPine-HCTZ 40-10-25 MG. Please take this one in the mornings. I am getting some blood work done as well. Will call you when the results come back.   Your blood glucose has also gotten worse since we last saw you.  I am prescribing you Jardiance to help control your blood sugar and also help with your kidney function.I refilled your prescriptions.  I have ordered the following labs for you:  Lab Orders         Glucose, capillary         Microalbumin / Creatinine Urine Ratio         Lipid Profile         Basic metabolic panel         POC Hbg A1C       Tests ordered today:    Referrals ordered today:   Referral Orders  No referral(s) requested today     I have ordered the following medication/changed the following medications:   Stop the following medications: Medications Discontinued During This Encounter  Medication Reason   amLODipine (NORVASC) 10 MG tablet Change in therapy   lisinopril (ZESTRIL) 40 MG tablet Change in therapy   hydrochlorothiazide (HYDRODIURIL) 25 MG tablet Change in therapy   rosuvastatin (CRESTOR) 20 MG tablet Reorder   Olmesartan-amLODIPine-HCTZ 40-10-25 MG TABS    metFORMIN (GLUCOPHAGE) 1000 MG tablet Reorder   metoprolol tartrate (LOPRESSOR) 100 MG tablet Reorder     Start the following medications: Meds ordered this encounter  Medications   DISCONTD: Olmesartan-amLODIPine-HCTZ 40-10-25 MG TABS    Sig: Take 1 tablet by mouth in the morning.    Dispense:  90 tablet    Refill:  3   rosuvastatin (CRESTOR) 20 MG tablet    Sig: Take 1 tablet (20 mg total) by mouth daily.    Dispense:  90 tablet    Refill:  1   Olmesartan-amLODIPine-HCTZ 40-10-25  MG TABS    Sig: Take 1 tablet by mouth in the morning.    Dispense:  90 tablet    Refill:  3   empagliflozin (JARDIANCE) 10 MG TABS tablet    Sig: Take 1 tablet (10 mg total) by mouth daily before breakfast.    Dispense:  90 tablet    Refill:  3   metFORMIN (GLUCOPHAGE) 1000 MG tablet    Sig: Take 0.5 tablets (500 mg total) by mouth 2 (two) times daily with a meal.    Dispense:  180 tablet    Refill:  3   metoprolol tartrate (LOPRESSOR) 100 MG tablet    Sig: Take 1 tablet (100 mg total) by mouth 2 (two) times daily.    Dispense:  180 tablet    Refill:  3     Follow up:    Remember:   Should you have any questions or concerns please call the internal medicine clinic at [phone].    Kathleen Lime, M.D [facility]

## 2023-03-16 LAB — BASIC METABOLIC PANEL
BUN/Creatinine Ratio: 20 (ref 12–28)
BUN: 18 mg/dL (ref 8–27)
CO2: 26 mmol/L (ref 20–29)
Calcium: 9.9 mg/dL (ref 8.7–10.3)
Chloride: 99 mmol/L (ref 96–106)
Creatinine, Ser: 0.9 mg/dL (ref 0.57–1.00)
Glucose: 250 mg/dL — ABNORMAL HIGH (ref 70–99)
Potassium: 4.7 mmol/L (ref 3.5–5.2)
Sodium: 140 mmol/L (ref 134–144)
eGFR: 70 mL/min/{1.73_m2} (ref >59)

## 2023-03-16 LAB — MICROALBUMIN / CREATININE URINE RATIO
Creatinine, Urine: 19.4 mg/dL
Microalb/Creat Ratio: 2187 mg/g{creat} — ABNORMAL HIGH (ref 0–29)
Microalbumin, Urine: 424.2 ug/mL

## 2023-03-16 LAB — LIPID PANEL
Chol/HDL Ratio: 4.1 {ratio} (ref 0.0–4.4)
Cholesterol, Total: 196 mg/dL (ref 100–199)
HDL: 48 mg/dL (ref >39)
LDL Chol Calc (NIH): 134 mg/dL — ABNORMAL HIGH (ref 0–99)
Triglycerides: 78 mg/dL (ref 0–149)
VLDL Cholesterol Cal: 14 mg/dL (ref 5–40)

## 2023-03-17 ENCOUNTER — Encounter: Payer: Self-pay | Admitting: Student

## 2023-03-17 NOTE — Assessment & Plan Note (Signed)
LDL of 142 in September 2024. Pt is on Crestor 20 mg. Endorses adherence and denies any myalgias at this time. Will recheck lipid profile today and adjust medication as needed.

## 2023-03-25 NOTE — Progress Notes (Signed)
 Internal Medicine Clinic Attending  Case discussed with the resident at the time of the visit.  We reviewed the resident's history and exam and pertinent patient test results.  I agree with the assessment, diagnosis, and plan of care documented in the resident's note.

## 2023-03-30 ENCOUNTER — Telehealth: Payer: Self-pay | Admitting: *Deleted

## 2023-03-30 ENCOUNTER — Other Ambulatory Visit: Payer: Self-pay | Admitting: Student

## 2023-03-30 DIAGNOSIS — I10 Essential (primary) hypertension: Secondary | ICD-10-CM

## 2023-03-30 MED ORDER — CHLORTHALIDONE 25 MG PO TABS: 25.0000 mg | ORAL_TABLET | Freq: Every day | ORAL | 3 refills | Status: DC

## 2023-03-30 MED ORDER — METOPROLOL TARTRATE 100 MG PO TABS: 100.0000 mg | ORAL_TABLET | Freq: Two times a day (BID) | ORAL | 3 refills | Status: DC

## 2023-03-30 MED ORDER — OLMESARTAN MEDOXOMIL 40 MG PO TABS: 40.0000 mg | ORAL_TABLET | Freq: Every day | ORAL | 11 refills | Status: DC

## 2023-03-30 NOTE — Telephone Encounter (Signed)
 Call from patient states is unable to get her new blood pressure medication.  Has to be ordered. Refused to see if another pharmacy in her area has it.   Patient states that she has been on Amlodipine before and it caused a lot of swelling and does not want to go back on it again.  Patient states she would prefer to stay on the multiple blood pressure meds instead of the 1 pill that was prescribed.  Would like medication sent to  [facility] in [place].

## 2023-03-30 NOTE — Progress Notes (Signed)
 Patient called and doesn't not want to be on her combo antihypertensive pill anymore . I am swtich her back to chlorthalidone (HYGROTON) 25 MG tablet,  metoprolol tartrate (LOPRESSOR) 100 MG tablet and olmesartan (BENICAR) 40 MG tablet for BP control

## 2023-03-30 NOTE — Telephone Encounter (Signed)
 Call to patient to inform her that Dr. Mickie Bail is going to send in the prescriptions for her previous blood pressure medication.

## 2023-04-09 ENCOUNTER — Telehealth: Payer: Self-pay | Admitting: *Deleted

## 2023-04-09 DIAGNOSIS — I10 Essential (primary) hypertension: Secondary | ICD-10-CM

## 2023-04-09 MED ORDER — CHLORTHALIDONE 25 MG PO TABS: 25.0000 mg | ORAL_TABLET | Freq: Every day | ORAL | 3 refills | Status: AC

## 2023-04-09 MED ORDER — OLMESARTAN MEDOXOMIL 40 MG PO TABS: 40.0000 mg | ORAL_TABLET | Freq: Every day | ORAL | 11 refills | Status: AC

## 2023-04-09 MED ORDER — METOPROLOL TARTRATE 100 MG PO TABS: 100.0000 mg | ORAL_TABLET | Freq: Two times a day (BID) | ORAL | 3 refills | Status: AC

## 2023-04-09 NOTE — Telephone Encounter (Addendum)
 Call from pt - stated she did not received her medication for her BP (Metoprolol, Olmesartan and [place]). Rxs were sent to [facility] in [place] - pt stated she no longer uses this pharmacy [facility] [place]  [facility] removed from chart per pt's request.

## 2023-10-10 ENCOUNTER — Other Ambulatory Visit: Payer: Self-pay | Admitting: Student

## 2023-10-10 DIAGNOSIS — I10 Essential (primary) hypertension: Secondary | ICD-10-CM

## 2023-10-11 NOTE — Telephone Encounter (Signed)
 Medication discontinued 03/15/23
